# Patient Record
Sex: Female | Born: 1955 | Race: White | Hispanic: No | Marital: Married | Smoking: Former smoker
Health system: Southern US, Community
[De-identification: ages and names within clinical notes are randomized; demographics above are authoritative.]

## PROBLEM LIST (undated history)

## (undated) DIAGNOSIS — I2699 Other pulmonary embolism without acute cor pulmonale: Secondary | ICD-10-CM

## (undated) DIAGNOSIS — M5410 Radiculopathy, site unspecified: Principal | ICD-10-CM

## (undated) DIAGNOSIS — I10 Essential (primary) hypertension: Secondary | ICD-10-CM

## (undated) DIAGNOSIS — E7800 Pure hypercholesterolemia, unspecified: Secondary | ICD-10-CM

## (undated) DIAGNOSIS — D649 Anemia, unspecified: Secondary | ICD-10-CM

## (undated) DIAGNOSIS — R001 Bradycardia, unspecified: Secondary | ICD-10-CM

## (undated) DIAGNOSIS — E1149 Type 2 diabetes mellitus with other diabetic neurological complication: Principal | ICD-10-CM

## (undated) DIAGNOSIS — E119 Type 2 diabetes mellitus without complications: Secondary | ICD-10-CM

## (undated) DIAGNOSIS — E039 Hypothyroidism, unspecified: Secondary | ICD-10-CM

## (undated) DIAGNOSIS — J189 Pneumonia, unspecified organism: Secondary | ICD-10-CM

## (undated) HISTORY — DX: Type 2 diabetes mellitus without complications: E11.9

## (undated) HISTORY — DX: Type 2 diabetes mellitus with other diabetic neurological complication: E11.49

## (undated) HISTORY — PX: APPENDECTOMY: SHX54

## (undated) HISTORY — PX: REFRACTIVE SURGERY: SHX103

## (undated) HISTORY — DX: Radiculopathy, site unspecified: M54.10

## (undated) HISTORY — PX: NECK SURGERY: SHX720

## (undated) HISTORY — DX: Other pulmonary embolism without acute cor pulmonale: I26.99

## (undated) HISTORY — DX: Bradycardia, unspecified: R00.1

## (undated) HISTORY — PX: TONSILLECTOMY: SUR1361

## (undated) HISTORY — DX: Pure hypercholesterolemia, unspecified: E78.00

## (undated) HISTORY — PX: OTHER SURGICAL HISTORY: SHX169

## (undated) HISTORY — DX: Essential (primary) hypertension: I10

## (undated) HISTORY — PX: ABDOMINAL HYSTERECTOMY: SHX81

---

## 1988-02-26 HISTORY — PX: BREAST SURGERY: SHX581

## 2008-01-20 ENCOUNTER — Inpatient Hospital Stay (HOSPITAL_COMMUNITY): Admission: AD | Admit: 2008-01-20 | Discharge: 2008-01-27 | Payer: Self-pay | Admitting: Internal Medicine

## 2008-01-20 ENCOUNTER — Ambulatory Visit: Payer: Self-pay | Admitting: Cardiology

## 2008-01-20 ENCOUNTER — Ambulatory Visit: Payer: Self-pay | Admitting: Internal Medicine

## 2008-01-22 ENCOUNTER — Encounter (INDEPENDENT_AMBULATORY_CARE_PROVIDER_SITE_OTHER): Payer: Self-pay | Admitting: *Deleted

## 2008-01-22 ENCOUNTER — Ambulatory Visit: Payer: Self-pay | Admitting: *Deleted

## 2008-01-29 ENCOUNTER — Ambulatory Visit: Payer: Self-pay | Admitting: Cardiology

## 2008-02-03 ENCOUNTER — Ambulatory Visit: Payer: Self-pay | Admitting: Cardiology

## 2008-06-08 ENCOUNTER — Encounter (HOSPITAL_COMMUNITY): Admission: RE | Admit: 2008-06-08 | Discharge: 2008-08-22 | Payer: Self-pay | Admitting: Unknown Physician Specialty

## 2008-07-11 ENCOUNTER — Ambulatory Visit (HOSPITAL_COMMUNITY): Admission: RE | Admit: 2008-07-11 | Discharge: 2008-07-11 | Payer: Self-pay | Admitting: Ophthalmology

## 2008-11-14 DIAGNOSIS — I498 Other specified cardiac arrhythmias: Secondary | ICD-10-CM

## 2008-12-08 ENCOUNTER — Encounter (HOSPITAL_COMMUNITY): Admission: RE | Admit: 2008-12-08 | Discharge: 2009-03-08 | Payer: Self-pay | Admitting: Unknown Physician Specialty

## 2009-02-25 HISTORY — PX: EXCISIONAL HEMORRHOIDECTOMY: SHX1541

## 2009-06-14 ENCOUNTER — Ambulatory Visit (HOSPITAL_COMMUNITY): Admission: RE | Admit: 2009-06-14 | Discharge: 2009-06-14 | Payer: Self-pay | Admitting: General Surgery

## 2009-07-20 ENCOUNTER — Encounter (HOSPITAL_COMMUNITY): Admission: RE | Admit: 2009-07-20 | Discharge: 2009-10-04 | Payer: Self-pay | Admitting: Unknown Physician Specialty

## 2010-03-01 ENCOUNTER — Encounter (HOSPITAL_COMMUNITY)
Admission: RE | Admit: 2010-03-01 | Discharge: 2010-03-27 | Payer: Self-pay | Source: Home / Self Care | Attending: Unknown Physician Specialty | Admitting: Unknown Physician Specialty

## 2010-05-15 LAB — BASIC METABOLIC PANEL
CO2: 23 mEq/L (ref 19–32)
Chloride: 94 mEq/L — ABNORMAL LOW (ref 96–112)
Creatinine, Ser: 0.98 mg/dL (ref 0.4–1.2)
Potassium: 4 mEq/L (ref 3.5–5.1)
Sodium: 130 mEq/L — ABNORMAL LOW (ref 135–145)

## 2010-05-15 LAB — CBC
Hemoglobin: 12.6 g/dL (ref 12.0–15.0)
MCV: 100.6 fL — ABNORMAL HIGH (ref 78.0–100.0)
Platelets: 286 10*3/uL (ref 150–400)
RDW: 17.7 % — ABNORMAL HIGH (ref 11.5–15.5)
WBC: 13.7 10*3/uL — ABNORMAL HIGH (ref 4.0–10.5)

## 2010-05-15 LAB — GLUCOSE, CAPILLARY

## 2010-07-10 NOTE — H&P (Signed)
[name], [name] NO.:  [id number]   MEDICAL RECORD NO.:  [id number]          PATIENT TYPE:  INP   LOCATION:  2031                         FACILITY:  [facility]   PHYSICIAN:  Rollene Rotunda, MD, FACCDATE OF BIRTH:  12-Jun-1955   DATE OF ADMISSION:  01/20/2008  DATE OF DISCHARGE:                              HISTORY & PHYSICAL   PRIMARY CARDIOLOGIST:  The patient is here at our group.  She is being  seen by Dr. Antoine Poche here at [facility] today.  She will be followed up  with the team at [facility] either Dr. Myrtis Ser or Dr. Andee Lineman post  hospitalization.   PRIMARY CARE:  Selinda Flavin.   RHEUMATOLOGIST:  Dr. Jennette Bill (Dr. Phylliss Bob).   We were asked to consult on this 55 year old Caucasian female for sinus  bradycardia, which is asymptomatic.  [name] has had multiple problems  recently with the diagnosis of left lower extremity DVT with Coumadin  initiation and Lovenox bridging, subsequently developed bilateral  pulmonary emboli and then also suffered a rheumatoid arthritis flare up  that has kept her here in the hospital for several days.  The patient  was noted to have a bradycardic rhythm yesterday.  She is dipped in the  high 30s to low 40s.  As stated, she has been asymptomatic.  Denies any  lightheadedness, dizziness, presyncopal episodes, or weakness.  Main  complaint has been joint pain associated with her rheumatoid arthritis.  [name] also states she was told 3 weeks ago that she had the swine  flu and was treated with Tamiflu at [facility].   PAST MEDICAL HISTORY:  1. Rheumatoid arthritis x2 years.  2. Fibromyalgia.  3. Recent diagnosis of DVT, left lower extremities, Coumadin and      Lovenox bridging.  4. Subsequently developed bilateral pulmonary emboli while therapeutic      on her Coumadin.  5. Also has a remote history of a DVT approximately 10 years ago in      her right lower extremity.  6. Diabetes.  7. Hypertension.  8.  Dyslipidemia.  9. Steroid dependency for RA.  10.Status post appendectomy.  11.Status post hysterectomy.  12.A 2-D echocardiogram obtained this admission showed mild diastolic      dysfunction with an EF of 60-65%.   SOCIAL HISTORY:  The patient lives in [place] with her husband.  She is a  Nutritional therapist at [employer].  Previous tobacco use, quit  greater than 10 years ago.  Denies any EtOH, drug, or herbal medication  use.  She does enjoy riding her stationary bike when she is able to.  Tries to follow ADA diet.   FAMILY HISTORY:  Mother deceased at age 43, suffered MI.  Father  deceased at age 49 secondary to MI.  He had his first MI at age 29.  Sister deceased secondary to liver disease.   REVIEW OF SYSTEMS:  Positive for fever, chills, hot flashes, chest pain,  shortness of breath, dyspnea on exertion, lower extremity edema,  myalgia, arthralgia, joint swelling, pain in joints, and occasional  polyuria.  ALLERGIES:  No known drug allergies.   MEDICATIONS:  1. Plaquenil 200 mg b.i.d.  2. Coumadin.  3. Glipizide 10.  4. Folic acid.  5. Lyrica.  6. Protonix.  7. Zocor 20.  8. Cimzia every 28-day injection.  9. Insulin sliding scale.  10.Prednisone 60 mg p.o. b.i.d.  11.Percocet.  12.Lantus insulin.   PHYSICAL EXAMINATION:  VITAL SIGNS:  Temperature 97.6, heart rate 46,  respirations 22, and blood pressure 150/77.  GENERAL:  [name] is in no acute distress.  HEENT:  Normocephalic and atraumatic.  Pupils equal, round, and reactive  to light.  She has a cushingoid appearance.  Cheeks are red/flushed.  NECK:  Supple without bruits, JVD.  CARDIOVASCULAR:  S1 and S2.  Brady rhythm.  Do not hear any murmurs,  rubs, or gallops.  LUNGS:  Clear to auscultation.  SKIN:  Warm and dry.  ABDOMEN:  Obese, soft, and nontender with positive bowel sounds.  LOWER EXTREMITIES:  Without clubbing or cyanosis.  She does have  nonpitting edema bilaterally, which she states is  chronic for her.  NEUROLOGIC:  Alert and oriented x3.   Chest x-ray shows enlargement of cardiopericardial silhouette, otherwise  no acute findings.  EKG sinus brady at a rate of 37.  No acute ST or T-  wave changes.   LABORATORY DATA:  H&H 9.9 and 29.8, WBC 7.2, and platelets 503,000.  Sodium 137, potassium 5.3, BUN 34, creatinine 1.05, and glucose 418.  TSH 1.632 and INR 3.3.  ANA negative.  Anti-DNA less than 1.  Folic RBC  1369.  Positive SLE antibody.   IMPRESSION:  1. Sinus brady, asymptomatic, unclear etiology at this time.  We will      plan on outpatient Holter monitor, check for chronotropic      components, have the patient followup in the [facility] office.  2. Positive systemic lupus erythematosus antibody.  3. Rheumatoid arthritis flare-up.  4. Chronic anticoagulation therapy.  5. Deep venous thrombosis/bilateral pulmonary emboli per attending      physician.  Dr. Rollene Rotunda has been in to examine and assess.      The patient is a 55 year old Caucasian female with no cardiac      history, now      bradycardic in the setting of RA exacerbation, DVT/pulmonary      emboli.  The patient will need outpatient 48 Holter monitor to      check chronotropic components then followup in the [facility] office.      Dr. Rollene Rotunda has been in to examine and assess.  The patient      agrees with plan of care.      Dorian Pod, ACNP      Rollene Rotunda, MD, [facility]  Electronically Signed    MB/MEDQ  D:  01/26/2008  T:  01/27/2008  Job:  [id number]

## 2010-07-10 NOTE — Assessment & Plan Note (Signed)
[facility]                          [facility] NOTE   [name], [name]                         MRN:          [id number]  DATE:02/03/2008                            DOB:          [birth year]    PRIMARY CARE PHYSICIAN:  Dr. Selinda Flavin   REASON FOR VISIT:  Followup bradycardia and cardiac monitor.   HISTORY OF PRESENT ILLNESS:  [name] is a pleasant 55 year old woman  with a history of rheumatoid arthritis, fibromyalgia, and recently  diagnosed deep venous thrombosis involving the left lower extremity with  subsequent development of bilateral pulmonary emboli.  She is on  Coumadin therapy followed by Dr. Dimas Aguas.  She was seen in consultation  by Dr. Antoine Poche at [facility] back in late November due to  sinus bradycardia.  She was noted to have heart rates dipping into the  30s and 40s, although was relatively asymptomatic at that time without  any frank dizziness, sense of palpitations, or syncope.  She underwent  an echocardiogram, which demonstrated normal left ventricular systolic  function with an ejection fraction of 60-65%, no regional wall motion  abnormalities, mild left ventricular hypertrophy, mildly increased  pulmonary artery pressures, and a minimal pericardial effusion.  She was  ultimately discharged home on no specific cardiac medications and was  arranged to wear a 48-hour cardiac monitor.  I reviewed this study  today.  Predominant rhythm is sinus ranging from bradycardia as low as  40 in the early morning hours while the patient was sleeping up to sinus  tachycardia of 118 beats per minute during waking hours.  Her mean heart  rate was 59 and she does not report any specific symptoms while wearing  her monitor.  She had no significant pauses and no ventricular ectopy.  Occasional premature atrial complexes were noted, mostly as single  beats, but with two atrial pairs and one 8-beat run of atrial  tachycardia.  I asked her about palpitations, dizziness, or syncope  today, and she denies having any problems with these symptoms.  She is  generally recuperating with her recent comorbid illnesses and has been  followed closely by Dr. Dimas Aguas.  She states that her energy is improving  daily.   ALLERGIES:  No known drug allergies.   MEDICATIONS:  1. Folic acid 1 mg p.o. daily.  2. Glipizide 10 mg p.o. daily.  3. Plaquenil 200 mg p.o. daily.  4. Lantus 20-30 units subcu nightly.  5. Protonix 40 mg p.o. b.i.d.  6. Prednisone 50 mg p.o. daily with taper underway.  7. Lyrica 75 mg 2 tablets p.o. b.i.d.  8. Coumadin as directed by Dr. Dimas Aguas.  9. Methotrexate 25 mg weekly.   REVIEW OF SYSTEMS:  As outlined above.  No hemoptysis.  Lower extremity  edema has improved significantly by her report.  No exertional chest  pain.  Otherwise negative.   PHYSICAL EXAMINATION:  VITAL SIGNS:  Blood pressure is 151/88, heart  rate is 67 and regular, weight is 207 pounds.  GENERAL:  The patient is comfortable, overweight, in no acute distress.  NECK:  No elevated jugular venous pressure or loud carotid bruits.  No  thyromegaly.  LUNGS:  Clear with diminished breath sounds.  CARDIAC:  Regular rate and rhythm.  No pathologic systolic murmur or  obvious pericardial rub.  No S3 gallop.  ABDOMEN:  Obese, nontender.  EXTREMITIES:  Exhibit resolving 1+ edema left greater than right.  Distal pulses are 1+.  SKIN:  Warm and dry.  MUSCULOSKELETAL:  No kyphosis noted.   IMPRESSION AND RECOMMENDATIONS:  Previous documentation of asymptomatic  sinus bradycardia in the setting of other active comorbid illnesses  including a flare of rheumatoid arthritis and also thromboembolic  disease with deep venous thrombosis and pulmonary emboli, requiring  Coumadin.  Ms. Butsch reports continued improvement as she recuperates  under the direction of Dr. Dimas Aguas.  She did wear a 48-hour monitor,  which does not indicate  any clear evidence of major sinus node  dysfunction, either chronotropic incompetence, or significant pauses.  Her most significant bradycardic events were at nighttime while she was  asleep.  She has otherwise been asymptomatic in terms of palpitations  despite findings of premature atrial complexes.  She had documentation  of normal left ventricular systolic function by recent echocardiography.  At this point, I would recommend observation and no other specific  cardiac evaluation unless she manifests any progressive symptomatology.  Evaluation for OSA could be considered at some point.  She is on no  medications which should be adversely affecting her heart rate.  She  will continue to follow up with Dr. Dimas Aguas and we can see her back as  needed.     Jonelle Sidle, MD  Electronically Signed    SGM/MedQ  DD: 02/03/2008  DT: 02/04/2008  Job #: [id number]   cc:   Selinda Flavin

## 2010-07-13 NOTE — Discharge Summary (Signed)
NAMEJACOLE, [name] NO.:  [id number]   MEDICAL RECORD NO.:  [id number]          PATIENT TYPE:  INP   LOCATION:  2031                         FACILITY:  [facility]   PHYSICIAN:  [name], M.D.    DATE OF BIRTH:  06/28/1955   DATE OF ADMISSION:  01/20/2008  DATE OF DISCHARGE:  01/27/2008                               DISCHARGE SUMMARY   PRIMARY CARE [name]:  [name] in [place].   DISCHARGE DIAGNOSES:  1. Pulmonary embolism. Anti-phospholipid antibody positive.  2. Deep vein thrombosis, left extremity.  3. Rheumatoid arthritis.  4. Type 2 diabetes.  5. Acute renal insufficiency.  6. Hypertension.  7. Hyperlipidemia.   DISCHARGE MEDICATIONS WITH ACCURATE DOSES:  1. Folic acid 1 mg daily.  2. Glipizide 10 mg daily.  3. Plaquenil 200 mg once daily.  4. Lantus insulin 20 units in evening only.  5. Protonix 40 mg 1 tablet twice daily.  6. Prednisone 60 mg tablet once daily for 3 days after the discharge,      50 mg tablet once daily for 3 days subsequently, to dose prednisone      as per rheumatologist after the visit.  7. Lyrica 150 mg 1 tablet twice daily.  8. Simvastatin 20 mg 1 tablet daily.  9. Coumadin 7.5 mg 1 tablet daily. GOAL INR 3.0 TO 4.0  10.Methotrexate 25 mg once weekly.  11.Cimzia per rheumatologist.  12.Blood pressure medications to be held until seen by primary care      [name].   DISPOSITION AND FOLLOWUP:  1. Follow up to see [name] on January 29, 2008, to call the      office for appointment time.  2. Follow up at [facility] in [facility] to get Holter      monitor and to see the physician on February 03, 2008, at 1 p.m.      NOTE HIGH THERAPEUTIC INR is needed (3.0 to 4.0)  3. Follow up with [name], primary care [name], on January 29, 2008, at 10:45 a.m.   PROCEDURE PERFORMED:  Chest x-ray shows enlargement of pericardial  silhouette without edema or peripheral aspirate consolidation.   CONSULTATIONS:  None.   ADMITTING HISTORY AND PHYSICAL:  The patient is a 55 year old woman with  past medical history significant for left extremity DVT 2 weeks ago,  history of right lower extremity DVT 10 years ago, hyperlipidemia, type  2 diabetes, rheumatoid arthritis, hypertension, and obesity. She  presented to the [facility] with left lower extremity pain two  weeks ago and was diagnosed with DVT. Subseuqntly, She was treated  Lovenox and Coumadin until January 15, 2008, at which time, her Lovenox  was discontinued and her Coumadin dose was increased from 5 mg to 7.5  mg.  She then went back to ED with complain of shortness of breath, and chest  pain.  The patient reported that around 6:30 p.m., the night of  presentation, she had left lower extremity pain with shortness of breath  and chest pain associated with breathing.  CT angio  revealed bilateral  pulmonary embolism while she was on coumidin with INR of 1.9.  The  patient was transfered for further management to the [facility].   PHYSICAL EXAMINATION:  VITALS ON PRESENTATION:  Temperature of 101.9,  pulse of 103, blood pressure of 142/66, respiratory rate of 21, and O2  saturation 97% on 4L on oxygen.  GENERAL:  No acute distress. The patient was comfortable in the bed.  HEENT:  Atraumatic.  The patient had a moon face without erythema,  positive for telangiectasias.  CARDIOVASCULAR:  S1 and S2 normal.  The patient was tachycardic, rhythm  was regular without murmur or regurgitation.  LUNGS:  Clear to auscultation bilaterally.  No wheezing, no rhonchi, and  no rales.  ABDOMEN:  Soft and obese without tenderness, without guarding. with  positive bowel sounds.  NEUROLOGIC:  Alert and oriented x3.  No motor or sensory deficits.  Reflexes are normal bilaterally.  Negative Romberg sign.  No focal  deficits.  EXTREMITIES:  Mild bilateral lower leg edema, nontender, erythematous  wrist, ankles, foot, and  fingers bilaterally.  No cord could be palpated  in the calf muscles.   LABORATORY DATA:  Sodium 135, potassium 4.2, chloride of 96, bicarb of  26, BUN of 9, creatinine 1.1, and glucose of 266.  Hemoglobin of 11,  white cell count of 14.4, platelet of 447, ANC of 17.6, MCV of 106.9,  RDW of 20.7, calcium of 9.9, CPK 25.3, INR 2.2.  CT angio showing  bilateral PE.   HOSPITAL COURSE:  Problem #1.  Pulmonary embolism.  The patient was  being dosed, therapeutic at the time of pulmonary embolism diagnosis.  The patient has experienced left pleuritic chest pain and shortness of  breath for which she needs to be admitted to hospital.  The patient was  previously diagnosed with DVT, treated  with Lovenox and Coumadin, the  biggest was of Coumadin alone with therapeutic INR of 1.9.  With that  diagnosis, the patient was admitted, considered for IV fluid  replacement, for Coumadin, fluids as needed.  The patient was discharged  with an INR of 3.1 and PTT of 34.5.  The patient was investigated for  lupus anticoagulant which was found as the assay revealed findings c/w  an anti=phopholipid antibody.  ANA was negative.  The patient needs  lifelong anticoagulation.  as she failed an INR of 2.0, [name] was  given a choice of lifelong therapy of lovenox or a trial on Coumadin  with a hiogh INR. She chose the latter but was willing to use Lovenox if  the high INR failed to prevent further thrombo-embolic events. She will  follow up with her primary care [name] with discharge for INR  monitoring.  Problem #2.  DVT.  The patient has symptoms of left lower extremity DVT.  The patient was anticoagulated with Lovenox and Coumadin.  The patient's  management will continue to be discussed.  Problem #3.  Rheumatoid arthritis.  The patient was continued Plaquenil  and prednisone dose was increased to 60 mg p.o. daily, however, the  patient continued to show signs and symptoms of rheumatoid arthritis   flare, multiple joints including wrist, ankles, and fingers were  swollen.  The steroid dose was increased to stress dose for intravenous  methylprednisolone.  The patient responded to the therapy, the joint  swelling decreased, the range of motion improved.  The patient had  lowering of white blood cell count to 17.5.  The patient  does not have  any fever during the hospitalization.  The patient was discharged with a  globe prednisone taper 60 mg of once daily dose of oral prednisone.  The  patient will follow up with [name] and he would determine the  duration and the dose of prednisone taper.  Problem #3.  Type 2 diabetes.  The patient had a high blood sugar likely  secondary to her type 2 diabetes as well as high-dose prednisone  therapy.  The patient was managed with Lantus insulin as well as sliding  scale insulin coverage.  The patient will be discharged with a dose of  Lantus insulin 20 units in a.m. only to prevent hypoglycemia as it might  occur with lower dose of prednisone.The patient will follow up with  primary care [name], [name] for continued diabetic management.  Problem #4.  Hypertension.  The patient at home was on antihypertensive  medicines including Benicar and hydrochlorothiazide.  These medicines  were not continued inpatient as the patient continued to show some poor  pressures and due to fear of developing hypertension, the patient was  discharged without continued antihypertensive medicines.  The patient  will follow up with primary care [name] for continued healthcare  management of hypertension.  Problem #5.  Hyperlipidemia.  The patient's lipid profile was tagged.  The patient had an LDL of 112 with HDL of 28.  The patient was treated  with simvastatin 20 mg tablet once daily.  The patient will follow up  with a primary care [name] for encouraging dose of statin if required.   VITALS AT THE TIME OF DISCHARGE:  Temperature 97.9, pulse of 64,   respiration of 18, blood pressure of 156/70, and O2 saturation of 97% on  room air.   LABORATORY DATA:  White blood cell count of 17.5, hemoglobin of 9.7,  hematocrit of 29.5, MCV of 105.2, and platelet of 471.  Sodium of 134,  potassium of 4.6, chloride of 104, bicarbonate 23, glucose of 445, BUN  of 20, creatinine of 0.83, and calcium of 5.0.      [name], [name]  Electronically Signed      [name], M.D.  Electronically Signed    RS/MEDQ  D:  02/08/2008  T:  02/09/2008  Job:  [id number]   cc:   Dr. Louann Sjogren

## 2010-09-18 ENCOUNTER — Encounter (HOSPITAL_COMMUNITY): Payer: Self-pay

## 2010-10-01 ENCOUNTER — Encounter (HOSPITAL_COMMUNITY): Payer: Self-pay

## 2010-10-30 ENCOUNTER — Encounter (HOSPITAL_COMMUNITY)
Admission: RE | Admit: 2010-10-30 | Discharge: 2010-10-30 | Disposition: A | Discharge status: Home / Self Care | Payer: 59 | Source: Ambulatory Visit | Attending: Rheumatology | Admitting: Rheumatology

## 2010-10-30 DIAGNOSIS — M069 Rheumatoid arthritis, unspecified: Secondary | ICD-10-CM | POA: Insufficient documentation

## 2010-11-13 ENCOUNTER — Encounter (HOSPITAL_COMMUNITY): Payer: 59

## 2010-11-27 LAB — PHOSPHORUS: Phosphorus: 4.7 mg/dL — ABNORMAL HIGH (ref 2.3–4.6)

## 2010-11-27 LAB — HEMOGLOBIN A1C: Mean Plasma Glucose: 171 mg/dL

## 2010-11-27 LAB — CBC
HCT: 27.7 % — ABNORMAL LOW (ref 36.0–46.0)
HCT: 28.9 % — ABNORMAL LOW (ref 36.0–46.0)
HCT: 29.5 % — ABNORMAL LOW (ref 36.0–46.0)
HCT: 30.1 % — ABNORMAL LOW (ref 36.0–46.0)
HCT: 30.6 % — ABNORMAL LOW (ref 36.0–46.0)
Hemoglobin: 9 g/dL — ABNORMAL LOW (ref 12.0–15.0)
Hemoglobin: 9.8 g/dL — ABNORMAL LOW (ref 12.0–15.0)
Hemoglobin: 9.9 g/dL — ABNORMAL LOW (ref 12.0–15.0)
MCHC: 32.4 g/dL (ref 30.0–36.0)
MCHC: 32.6 g/dL (ref 30.0–36.0)
MCHC: 32.8 g/dL (ref 30.0–36.0)
MCHC: 33.7 g/dL (ref 30.0–36.0)
MCV: 105.8 fL — ABNORMAL HIGH (ref 78.0–100.0)
MCV: 106 fL — ABNORMAL HIGH (ref 78.0–100.0)
MCV: 106 fL — ABNORMAL HIGH (ref 78.0–100.0)
Platelets: 413 10*3/uL — ABNORMAL HIGH (ref 150–400)
Platelets: 446 10*3/uL — ABNORMAL HIGH (ref 150–400)
Platelets: 450 10*3/uL — ABNORMAL HIGH (ref 150–400)
Platelets: 484 10*3/uL — ABNORMAL HIGH (ref 150–400)
Platelets: 547 10*3/uL — ABNORMAL HIGH (ref 150–400)
RBC: 2.79 MIL/uL — ABNORMAL LOW (ref 3.87–5.11)
RDW: 20.5 % — ABNORMAL HIGH (ref 11.5–15.5)
RDW: 20.9 % — ABNORMAL HIGH (ref 11.5–15.5)
RDW: 21 % — ABNORMAL HIGH (ref 11.5–15.5)
RDW: 21.1 % — ABNORMAL HIGH (ref 11.5–15.5)
WBC: 18.1 10*3/uL — ABNORMAL HIGH (ref 4.0–10.5)

## 2010-11-27 LAB — DIFFERENTIAL
Basophils Absolute: 0 10*3/uL (ref 0.0–0.1)
Basophils Absolute: 0.1 10*3/uL (ref 0.0–0.1)
Basophils Relative: 0 % (ref 0–1)
Basophils Relative: 0 % (ref 0–1)
Eosinophils Absolute: 0 10*3/uL (ref 0.0–0.7)
Eosinophils Absolute: 0 10*3/uL (ref 0.0–0.7)
Eosinophils Relative: 0 % (ref 0–5)
Eosinophils Relative: 0 % (ref 0–5)
Monocytes Absolute: 0.4 10*3/uL (ref 0.1–1.0)

## 2010-11-27 LAB — GLUCOSE, CAPILLARY
Glucose-Capillary: 131 mg/dL — ABNORMAL HIGH (ref 70–99)
Glucose-Capillary: 297 mg/dL — ABNORMAL HIGH (ref 70–99)
Glucose-Capillary: 306 mg/dL — ABNORMAL HIGH (ref 70–99)
Glucose-Capillary: 336 mg/dL — ABNORMAL HIGH (ref 70–99)
Glucose-Capillary: 344 mg/dL — ABNORMAL HIGH (ref 70–99)
Glucose-Capillary: 360 mg/dL — ABNORMAL HIGH (ref 70–99)
Glucose-Capillary: 411 mg/dL — ABNORMAL HIGH (ref 70–99)
Glucose-Capillary: 426 mg/dL — ABNORMAL HIGH (ref 70–99)
Glucose-Capillary: 468 mg/dL — ABNORMAL HIGH (ref 70–99)

## 2010-11-27 LAB — BASIC METABOLIC PANEL
BUN: 13 mg/dL (ref 6–23)
BUN: 13 mg/dL (ref 6–23)
BUN: 28 mg/dL — ABNORMAL HIGH (ref 6–23)
BUN: 35 mg/dL — ABNORMAL HIGH (ref 6–23)
CO2: 21 mEq/L (ref 19–32)
CO2: 21 mEq/L (ref 19–32)
CO2: 23 mEq/L (ref 19–32)
CO2: 25 mEq/L (ref 19–32)
Calcium: 9.4 mg/dL (ref 8.4–10.5)
Chloride: 104 mEq/L (ref 96–112)
Chloride: 106 mEq/L (ref 96–112)
Chloride: 99 mEq/L (ref 96–112)
Creatinine, Ser: 1.06 mg/dL (ref 0.4–1.2)
GFR calc Af Amer: 44 mL/min — ABNORMAL LOW (ref >60)
GFR calc Af Amer: >60 mL/min (ref >60)
GFR calc non Af Amer: 36 mL/min — ABNORMAL LOW (ref >60)
GFR calc non Af Amer: 53 mL/min — ABNORMAL LOW (ref >60)
GFR calc non Af Amer: 55 mL/min — ABNORMAL LOW (ref >60)
GFR calc non Af Amer: 59 mL/min — ABNORMAL LOW (ref >60)
GFR calc non Af Amer: >60 mL/min (ref >60)
Glucose, Bld: 226 mg/dL — ABNORMAL HIGH (ref 70–99)
Glucose, Bld: 332 mg/dL — ABNORMAL HIGH (ref 70–99)
Glucose, Bld: 381 mg/dL — ABNORMAL HIGH (ref 70–99)
Glucose, Bld: 480 mg/dL — ABNORMAL HIGH (ref 70–99)
Potassium: 4 mEq/L (ref 3.5–5.1)
Potassium: 4.4 mEq/L (ref 3.5–5.1)
Potassium: 4.9 mEq/L (ref 3.5–5.1)
Sodium: 134 mEq/L — ABNORMAL LOW (ref 135–145)
Sodium: 137 mEq/L (ref 135–145)
Sodium: 137 mEq/L (ref 135–145)

## 2010-11-27 LAB — ANTIPHOSPHOLIPID SYNDROME EVAL, BLD
DRVVT: 98 secs — ABNORMAL HIGH (ref 36.1–47.0)
PTT Lupus Anticoagulant: 94.8 secs — ABNORMAL HIGH (ref 36.3–48.8)
PTTLA Confirmation: 14.4 secs — ABNORMAL HIGH (ref <8.0)
dRVVT Incubated 1:1 Mix: 43.3 secs (ref 36.1–47.0)

## 2010-11-27 LAB — IRON AND TIBC: Saturation Ratios: 4 % — ABNORMAL LOW (ref 20–55)

## 2010-11-27 LAB — CARDIAC PANEL(CRET KIN+CKTOT+MB+TROPI)
Relative Index: INVALID (ref 0.0–2.5)
Relative Index: INVALID (ref 0.0–2.5)
Total CK: 13 U/L (ref 7–177)
Total CK: 16 U/L (ref 7–177)
Troponin I: 0.01 ng/mL (ref 0.00–0.06)
Troponin I: 0.04 ng/mL (ref 0.00–0.06)

## 2010-11-27 LAB — CULTURE, BLOOD (ROUTINE X 2)

## 2010-11-27 LAB — URINALYSIS, ROUTINE W REFLEX MICROSCOPIC
Glucose, UA: NEGATIVE mg/dL
Hgb urine dipstick: NEGATIVE
Ketones, ur: NEGATIVE mg/dL
Leukocytes, UA: NEGATIVE
pH: 5.5 (ref 5.0–8.0)

## 2010-11-27 LAB — COMPREHENSIVE METABOLIC PANEL
ALT: 22 U/L (ref 0–35)
AST: 15 U/L (ref 0–37)
Albumin: 2.9 g/dL — ABNORMAL LOW (ref 3.5–5.2)
Alkaline Phosphatase: 66 U/L (ref 39–117)
BUN: 11 mg/dL (ref 6–23)
Creatinine, Ser: 0.96 mg/dL (ref 0.4–1.2)
GFR calc Af Amer: >60 mL/min (ref >60)
Glucose, Bld: 94 mg/dL (ref 70–99)
Potassium: 3.8 mEq/L (ref 3.5–5.1)
Sodium: 137 mEq/L (ref 135–145)
Total Bilirubin: 0.7 mg/dL (ref 0.3–1.2)

## 2010-11-27 LAB — URINE MICROSCOPIC-ADD ON

## 2010-11-27 LAB — LIPID PANEL
Cholesterol: 174 mg/dL (ref 0–200)
HDL: 28 mg/dL — ABNORMAL LOW (ref >39)
Total CHOL/HDL Ratio: 6.2 RATIO
VLDL: 34 mg/dL (ref 0–40)

## 2010-11-27 LAB — PROTIME-INR
INR: 2.1 — ABNORMAL HIGH (ref 0.00–1.49)
INR: 3.8 — ABNORMAL HIGH (ref 0.00–1.49)
Prothrombin Time: 31.7 seconds — ABNORMAL HIGH (ref 11.6–15.2)
Prothrombin Time: 40.7 seconds — ABNORMAL HIGH (ref 11.6–15.2)

## 2010-11-27 LAB — HEPARIN LEVEL (UNFRACTIONATED)
Heparin Unfractionated: 0.12 IU/mL — ABNORMAL LOW (ref 0.30–0.70)
Heparin Unfractionated: 0.6 IU/mL (ref 0.30–0.70)
Heparin Unfractionated: 0.7 IU/mL (ref 0.30–0.70)

## 2010-11-27 LAB — SODIUM, URINE, RANDOM: Sodium, Ur: 86 mEq/L

## 2010-11-27 LAB — OCCULT BLOOD X 1 CARD TO LAB, STOOL: Fecal Occult Bld: NEGATIVE

## 2010-11-30 LAB — CBC
HCT: 29.8 % — ABNORMAL LOW (ref 36.0–46.0)
Hemoglobin: 9.7 g/dL — ABNORMAL LOW (ref 12.0–15.0)
Hemoglobin: 9.9 g/dL — ABNORMAL LOW (ref 12.0–15.0)
Platelets: 503 10*3/uL — ABNORMAL HIGH (ref 150–400)
RBC: 2.8 MIL/uL — ABNORMAL LOW (ref 3.87–5.11)
RDW: 20.8 % — ABNORMAL HIGH (ref 11.5–15.5)
WBC: 17.2 10*3/uL — ABNORMAL HIGH (ref 4.0–10.5)
WBC: 17.5 10*3/uL — ABNORMAL HIGH (ref 4.0–10.5)

## 2010-11-30 LAB — GLUCOSE, CAPILLARY
Glucose-Capillary: 382 mg/dL — ABNORMAL HIGH (ref 70–99)
Glucose-Capillary: 387 mg/dL — ABNORMAL HIGH (ref 70–99)
Glucose-Capillary: 393 mg/dL — ABNORMAL HIGH (ref 70–99)
Glucose-Capillary: 418 mg/dL — ABNORMAL HIGH (ref 70–99)
Glucose-Capillary: 427 mg/dL — ABNORMAL HIGH (ref 70–99)
Glucose-Capillary: 495 mg/dL — ABNORMAL HIGH (ref 70–99)

## 2010-11-30 LAB — BASIC METABOLIC PANEL
Calcium: 9 mg/dL (ref 8.4–10.5)
Calcium: 9.7 mg/dL (ref 8.4–10.5)
Creatinine, Ser: 0.83 mg/dL (ref 0.4–1.2)
GFR calc Af Amer: >60 mL/min (ref >60)
GFR calc non Af Amer: 55 mL/min — ABNORMAL LOW (ref >60)
GFR calc non Af Amer: >60 mL/min (ref >60)
Glucose, Bld: 418 mg/dL — ABNORMAL HIGH (ref 70–99)
Potassium: 5.3 mEq/L — ABNORMAL HIGH (ref 3.5–5.1)
Sodium: 134 mEq/L — ABNORMAL LOW (ref 135–145)
Sodium: 137 mEq/L (ref 135–145)

## 2010-11-30 LAB — PROTIME-INR
INR: 3.1 — ABNORMAL HIGH (ref 0.00–1.49)
INR: 3.3 — ABNORMAL HIGH (ref 0.00–1.49)
Prothrombin Time: 34.5 seconds — ABNORMAL HIGH (ref 11.6–15.2)

## 2011-05-08 ENCOUNTER — Other Ambulatory Visit (HOSPITAL_COMMUNITY): Payer: Self-pay | Admitting: *Deleted

## 2011-05-09 ENCOUNTER — Encounter (HOSPITAL_COMMUNITY)
Admission: RE | Admit: 2011-05-09 | Discharge: 2011-05-09 | Disposition: A | Discharge status: Home / Self Care | Payer: 59 | Source: Ambulatory Visit | Attending: Rheumatology | Admitting: Rheumatology

## 2011-05-09 DIAGNOSIS — M069 Rheumatoid arthritis, unspecified: Secondary | ICD-10-CM | POA: Insufficient documentation

## 2011-05-09 MED ORDER — SODIUM CHLORIDE 0.9 % IV SOLN
1000.0000 mg | INTRAVENOUS | Status: DC
  Administered 2011-05-09: 1000 mg via INTRAVENOUS
  Filled 2011-05-09: qty 100

## 2011-05-09 MED ORDER — SODIUM CHLORIDE 0.9 % IV SOLN
INTRAVENOUS | Status: DC
  Administered 2011-05-09: 250 mL via INTRAVENOUS

## 2011-05-09 MED ORDER — METHYLPREDNISOLONE SODIUM SUCC 125 MG IJ SOLR
100.0000 mg | INTRAMUSCULAR | Status: DC
  Administered 2011-05-09: 100 mg via INTRAVENOUS
  Filled 2011-05-09: qty 2

## 2011-05-23 ENCOUNTER — Encounter (HOSPITAL_COMMUNITY)
Admission: RE | Admit: 2011-05-23 | Discharge: 2011-05-23 | Disposition: A | Discharge status: Home / Self Care | Payer: 59 | Source: Ambulatory Visit | Attending: Rheumatology | Admitting: Rheumatology

## 2011-05-23 MED ORDER — SODIUM CHLORIDE 0.9 % IV SOLN
INTRAVENOUS | Status: AC
  Administered 2011-05-23: 09:00:00 via INTRAVENOUS

## 2011-05-23 MED ORDER — SODIUM CHLORIDE 0.9 % IV SOLN
1000.0000 mg | INTRAVENOUS | Status: AC
  Administered 2011-05-23: 1000 mg via INTRAVENOUS
  Filled 2011-05-23: qty 100

## 2011-05-23 MED ORDER — METHYLPREDNISOLONE SODIUM SUCC 125 MG IJ SOLR
100.0000 mg | INTRAMUSCULAR | Status: AC
  Administered 2011-05-23: 100 mg via INTRAVENOUS
  Filled 2011-05-23: qty 2

## 2011-07-31 DIAGNOSIS — N189 Chronic kidney disease, unspecified: Secondary | ICD-10-CM

## 2012-01-29 ENCOUNTER — Other Ambulatory Visit (HOSPITAL_COMMUNITY): Payer: Self-pay | Admitting: *Deleted

## 2012-01-30 ENCOUNTER — Encounter (HOSPITAL_COMMUNITY)
Admission: RE | Admit: 2012-01-30 | Discharge: 2012-01-30 | Disposition: A | Discharge status: Home / Self Care | Payer: 59 | Source: Ambulatory Visit | Attending: Rheumatology | Admitting: Rheumatology

## 2012-01-30 DIAGNOSIS — M069 Rheumatoid arthritis, unspecified: Secondary | ICD-10-CM | POA: Insufficient documentation

## 2012-01-30 MED ORDER — METHYLPREDNISOLONE SODIUM SUCC 125 MG IJ SOLR
INTRAMUSCULAR | Status: AC
  Administered 2012-01-30: 100 mg via INTRAVENOUS
  Filled 2012-01-30: qty 2

## 2012-01-30 MED ORDER — SODIUM CHLORIDE 0.9 % IV SOLN
INTRAVENOUS | Status: DC
  Administered 2012-01-30: 250 mL via INTRAVENOUS

## 2012-01-30 MED ORDER — SODIUM CHLORIDE 0.9 % IV SOLN
1000.0000 mg | INTRAVENOUS | Status: DC
  Administered 2012-01-30: 1000 mg via INTRAVENOUS
  Filled 2012-01-30: qty 100

## 2012-01-30 MED ORDER — METHYLPREDNISOLONE SODIUM SUCC 125 MG IJ SOLR
100.0000 mg | INTRAMUSCULAR | Status: DC
  Administered 2012-01-30: 100 mg via INTRAVENOUS

## 2012-02-13 ENCOUNTER — Encounter (HOSPITAL_COMMUNITY)
Admission: RE | Admit: 2012-02-13 | Discharge: 2012-02-13 | Disposition: A | Discharge status: Home / Self Care | Payer: 59 | Source: Ambulatory Visit | Attending: Rheumatology | Admitting: Rheumatology

## 2012-02-13 MED ORDER — METHYLPREDNISOLONE SODIUM SUCC 125 MG IJ SOLR
INTRAMUSCULAR | Status: AC
  Filled 2012-02-13: qty 2

## 2012-02-13 MED ORDER — METHYLPREDNISOLONE SODIUM SUCC 125 MG IJ SOLR
100.0000 mg | INTRAMUSCULAR | Status: AC
  Administered 2012-02-13: 100 mg via INTRAVENOUS

## 2012-02-13 MED ORDER — SODIUM CHLORIDE 0.9 % IV SOLN
1000.0000 mg | INTRAVENOUS | Status: AC
  Administered 2012-02-13: 1000 mg via INTRAVENOUS
  Filled 2012-02-13: qty 100

## 2012-02-13 MED ORDER — SODIUM CHLORIDE 0.9 % IV SOLN
INTRAVENOUS | Status: AC
  Administered 2012-02-13: 09:00:00 via INTRAVENOUS

## 2013-03-19 ENCOUNTER — Encounter (HOSPITAL_COMMUNITY): Payer: 59

## 2013-04-01 ENCOUNTER — Other Ambulatory Visit (HOSPITAL_COMMUNITY): Payer: Self-pay | Admitting: *Deleted

## 2013-04-02 ENCOUNTER — Encounter (HOSPITAL_COMMUNITY)
Admission: RE | Admit: 2013-04-02 | Discharge: 2013-04-02 | Disposition: A | Discharge status: Home / Self Care | Payer: 59 | Source: Ambulatory Visit | Attending: Rheumatology | Admitting: Rheumatology

## 2013-04-02 DIAGNOSIS — M069 Rheumatoid arthritis, unspecified: Secondary | ICD-10-CM | POA: Insufficient documentation

## 2013-04-02 MED ORDER — METHYLPREDNISOLONE SODIUM SUCC 125 MG IJ SOLR
100.0000 mg | Freq: Once | INTRAMUSCULAR | Status: AC
  Administered 2013-04-02: 100 mg via INTRAVENOUS

## 2013-04-02 MED ORDER — METHYLPREDNISOLONE SODIUM SUCC 125 MG IJ SOLR
INTRAMUSCULAR | Status: AC
  Filled 2013-04-02: qty 2

## 2013-04-02 MED ORDER — SODIUM CHLORIDE 0.9 % IV SOLN
INTRAVENOUS | Status: DC
  Administered 2013-04-02: 09:00:00 via INTRAVENOUS

## 2013-04-02 MED ORDER — SODIUM CHLORIDE 0.9 % IV SOLN
1000.0000 mg | Freq: Once | INTRAVENOUS | Status: AC
  Administered 2013-04-02: 1000 mg via INTRAVENOUS
  Filled 2013-04-02: qty 100

## 2013-04-02 MED ORDER — LORATADINE 10 MG PO TABS: 10.0000 mg | ORAL_TABLET | Freq: Every day | ORAL | Status: DC

## 2013-04-16 ENCOUNTER — Encounter (HOSPITAL_COMMUNITY)
Admission: RE | Admit: 2013-04-16 | Discharge: 2013-04-16 | Disposition: A | Discharge status: Home / Self Care | Payer: 59 | Source: Ambulatory Visit | Attending: Rheumatology | Admitting: Rheumatology

## 2013-04-16 MED ORDER — SODIUM CHLORIDE 0.9 % IV SOLN
1000.0000 mg | Freq: Once | INTRAVENOUS | Status: AC
  Administered 2013-04-16: 1000 mg via INTRAVENOUS
  Filled 2013-04-16: qty 100

## 2013-04-16 MED ORDER — LORATADINE 10 MG PO TABS: 10.0000 mg | ORAL_TABLET | Freq: Every day | ORAL | Status: DC

## 2013-04-16 MED ORDER — METHYLPREDNISOLONE SODIUM SUCC 125 MG IJ SOLR
100.0000 mg | Freq: Once | INTRAMUSCULAR | Status: AC
  Administered 2013-04-16: 100 mg via INTRAVENOUS

## 2013-04-16 MED ORDER — METHYLPREDNISOLONE SODIUM SUCC 125 MG IJ SOLR
INTRAMUSCULAR | Status: AC
  Administered 2013-04-16: 100 mg via INTRAVENOUS
  Filled 2013-04-16: qty 2

## 2013-04-16 MED ORDER — SODIUM CHLORIDE 0.9 % IV SOLN
INTRAVENOUS | Status: DC
  Administered 2013-04-16: 250 mL via INTRAVENOUS

## 2014-03-24 LAB — HEMOGLOBIN A1C: Hemoglobin A1C: 8.5

## 2014-10-06 ENCOUNTER — Ambulatory Visit (INDEPENDENT_AMBULATORY_CARE_PROVIDER_SITE_OTHER): Payer: 59 | Admitting: Orthopedic Surgery

## 2014-10-06 ENCOUNTER — Encounter: Payer: Self-pay | Admitting: Orthopedic Surgery

## 2014-10-06 VITALS — BP 150/80 | Ht 61.0 in | Wt 184.0 lb

## 2014-10-06 DIAGNOSIS — M7532 Calcific tendinitis of left shoulder: Secondary | ICD-10-CM | POA: Diagnosis not present

## 2014-10-06 NOTE — Progress Notes (Signed)
Patient ID: [name], female   DOB: March 07, 1955, 59 y.o.   MRN: [id number]  Consult request Dr. Rory Percy [facility]  [facility]  Reason for presentation severe pain left shoulder after fall about 6 months ago Chief Complaint  Patient presents with  . Shoulder Pain    Left shoulder pain, no injury. Referred by Dr. Nadara Mustard for Consult.     [name] is a 59 y.o. female.   HPI 59 year old female fell about 6 months ago presented to her doctor with pain stiffness and sharp throbbing burning stabbing aching pain in the left shoulder associated with some numbness and tingling. Her pain became constant. She rated it 9 out of 10. She has come finding variables of diabetes and rheumatoid arthritis  She had x-rays and MRI. To date she has not had injection and physical therapy  She comes for evaluation and treatment.  Weakness is not a major complaint although she has lost motion in the left shoulder  Review of systems night sweats and fatigue sinus problems vision problems from diabetes shortness of breath cough ankle leg edema back pain swollen joint stiffness joints muscle weakness limb pain joint pain lightheadedness weakness burning pain in her legs diabetic fibromyalgia tingling numbness weakness other systems were reviewed and were normal  She has a history of diabetes pneumonia GERD blood clots hypertension arthritis  She's had multiple operations she's allergic to latex  Family history diabetes COPD reflux liver disease hypertension heart attack kidney disease cancer arthritis  Does not smoke or drink  Surgeries appendectomy tonsillectomy cyst removed from her neck to from her left breast hysterectomy laser surgery on left eye for glaucoma bleeding hemorrhoids colonoscopy hemorrhoidectomy Review of Systems See hpi  Past Medical History  Diagnosis Date  . Bradycardia     History reviewed. No pertinent past surgical  history.  History reviewed. No pertinent family history.  Social History Social History  Substance Use Topics  . Smoking status: None  . Smokeless tobacco: None  . Alcohol Use: None    No Known Allergies  Current Outpatient Prescriptions  Medication Sig Dispense Refill  . acetaminophen (TYLENOL) 500 MG tablet Take 500 mg by mouth every 6 (six) hours as needed.    . DULoxetine (CYMBALTA) 60 MG capsule Take 60 mg by mouth daily.    Marland Kitchen ezetimibe (ZETIA) 10 MG tablet Take 10 mg by mouth daily.    . folic acid (FOLVITE) 1 MG tablet Take 1 mg by mouth daily.    . insulin glargine (LANTUS) 100 UNIT/ML injection Inject 35 Units into the skin at bedtime.    . insulin lispro (HUMALOG) 100 UNIT/ML injection Inject into the skin 3 (three) times daily before meals. Sliding scale    . Liraglutide (VICTOZA) 18 MG/3ML SOPN Inject 1.8 mg into the skin every morning.    . metFORMIN (GLUCOPHAGE) 1000 MG tablet Take 1,200 mg by mouth 2 (two) times daily with a meal.    . methotrexate (RHEUMATREX) 2.5 MG tablet Take 2.5 mg by mouth once a week. Caution:Chemotherapy. Protect from light. Takes 7 tablets every Monday night only    . olmesartan-hydrochlorothiazide (BENICAR HCT) 40-25 MG per tablet Take 1 tablet by mouth daily. 1/2 tab daily    . pantoprazole (PROTONIX) 40 MG tablet Take 40 mg by mouth 2 (two) times daily.    . predniSONE (DELTASONE) 5 MG tablet Take 5 mg by mouth daily with breakfast.    . pregabalin (LYRICA)  150 MG capsule Take 150 mg by mouth 2 (two) times daily.    . Vitamin D, Ergocalciferol, (DRISDOL) 50000 UNITS CAPS capsule Take 50,000 Units by mouth every 7 (seven) days. Every Monday    . warfarin (COUMADIN) 7.5 MG tablet Take 7.5 mg by mouth daily. Except Thursdays 9 mg every Thrusday     No current facility-administered medications for this visit.       Physical Exam Blood pressure 150/80, height 5\' 1"  (1.549 m), weight 184 lb (83.462 kg). Physical Exam The patient is well  developed well nourished and well groomed. Orientation to person place and time is normal  Mood is pleasant. Ambulatory status she walked in no assistive devices  Right shoulder full range of motion stability strength alignment no tenderness neck nontender except on the left side where she has some super scapular tenderness  Left shoulder range of motion external rotation was normal  Forward elevation was painful and she could lift her arm up to 120 I could lift 250  Impingement sign was positive  Shoulder was stable no weakness. Skin was intact sensation was normal she had good pulses and lymph nodes were negative   Data Reviewed I reviewed a lot of data her history from her doctor's notes her medication list her medical history her MRI personally reviewed I interpreted that as no tear but calcification of the rotator cuff her x-ray shows before meals joint arthritis report also reviewed    Assessment Calcific tendinitis left shoulder Plan Inject left shoulder and physical therapy  Procedure note the subacromial injection shoulder left   Verbal consent was obtained to inject the  Left   Shoulder  Timeout was completed to confirm the injection site is a subacromial space of the  left  shoulder  Medication used Depo-Medrol 40 mg and lidocaine 1% 3 cc  Anesthesia was provided by ethyl chloride  The injection was performed in the left  posterior subacromial space. After pinning the skin with alcohol and anesthetized the skin with ethyl chloride the subacromial space was injected using a 20-gauge needle. There were no complications  Sterile dressing was applied.

## 2014-10-06 NOTE — Patient Instructions (Signed)
Call [facility] outpatient therapy dept to schedule therapy visits

## 2014-10-07 NOTE — Addendum Note (Signed)
Addended by: Baldomero Lamy B on: 10/07/2014 09:05 AM   Modules accepted: Medications

## 2014-10-07 NOTE — Addendum Note (Signed)
Addended by: Baldomero Lamy B on: 10/07/2014 09:14 AM   Modules accepted: Medications

## 2014-11-07 LAB — HEMOGLOBIN A1C: Hemoglobin A1C: 8

## 2014-12-06 ENCOUNTER — Ambulatory Visit (INDEPENDENT_AMBULATORY_CARE_PROVIDER_SITE_OTHER): Payer: 59 | Admitting: Orthopedic Surgery

## 2014-12-06 VITALS — BP 151/80 | Ht 61.0 in | Wt 187.0 lb

## 2014-12-06 DIAGNOSIS — M7532 Calcific tendinitis of left shoulder: Secondary | ICD-10-CM

## 2014-12-06 NOTE — Progress Notes (Signed)
Patient ID: [name], female   DOB: 1955/03/20, 59 y.o.   MRN: [id number]  Follow up visit  Chief Complaint  Patient presents with  . Follow-up    2 month follow up left shoulder pain s/p injection + therapy    BP 151/80 mmHg  Ht 5\' 1"  (1.549 m)  Wt 187 lb (84.823 kg)  BMI 35.35 kg/m2   LAST VISIT   Consult request Dr. Rory Percy [facility]  [facility]  Reason for presentation severe pain left shoulder after fall about 6 months ago Chief Complaint  Patient presents with  . Shoulder Pain    Left shoulder pain, no injury. Referred by Dr. Nadara Mustard for Consult.     [name] is a 59 y.o. female.  HPI 59 year old female fell about 6 months ago presented to her doctor with pain stiffness and sharp throbbing burning stabbing aching pain in the left shoulder associated with some numbness and tingling. Her pain became constant. She rated it 9 out of 10. She has come finding variables of diabetes and rheumatoid arthritis  She had x-rays and MRI. To date she has not had injection and physical therapy      Assessment Calcific tendinitis left shoulder Plan Inject left shoulder and physical therapy   The patient has excellent motion just pain daily but only occasionally during the day depending on the movement  Denies numbness tingling or weakness  Exam shows full range of motion in all planes no specific tenderness around the shoulder excellent motor function stable and in external rotation and abduction normal neurovascular exam Procedure note the subacromial injection shoulder left   Verbal consent was obtained to inject the  Left   Shoulder  Timeout was completed to confirm the injection site is a subacromial space of the  left  shoulder  Medication used Depo-Medrol 40 mg and lidocaine 1% 3 cc  Anesthesia was provided by ethyl chloride  The injection was performed in the left  posterior subacromial space.  After pinning the skin with alcohol and anesthetized the skin with ethyl chloride the subacromial space was injected using a 20-gauge needle. There were no complications  Sterile dressing was applied.          Calcific tendinitis recommend repeat injection continue therapy follow-up as needed

## 2014-12-06 NOTE — Patient Instructions (Signed)
Continue home exercises  Joint Injection Care After Refer to this sheet in the next few days. These instructions provide you with information on caring for yourself after you have had a joint injection. Your caregiver also may give you more specific instructions. Your treatment has been planned according to current medical practices, but problems sometimes occur. Call your caregiver if you have any problems or questions after your procedure. After any type of joint injection, it is not uncommon to experience:  Soreness, swelling, or bruising around the injection site.  Mild numbness, tingling, or weakness around the injection site caused by the numbing medicine used before or with the injection. It also is possible to experience the following effects associated with the specific agent after injection:  Iodine-based contrast agents:  Allergic reaction (itching, hives, widespread redness, and swelling beyond the injection site).  Corticosteroids (These effects are rare.):  Allergic reaction.  Increased blood sugar levels (If you have diabetes and you notice that your blood sugar levels have increased, notify your caregiver).  Increased blood pressure levels.  Mood swings.  Hyaluronic acid in the use of viscosupplementation.  Temporary heat or redness.  Temporary rash and itching.  Increased fluid accumulation in the injected joint. These effects all should resolve within a day after your procedure.  HOME CARE INSTRUCTIONS  Limit yourself to light activity the day of your procedure. Avoid lifting heavy objects, bending, stooping, or twisting.  Take prescription or over-the-counter pain medication as directed by your caregiver.  You may apply ice to your injection site to reduce pain and swelling the day of your procedure. Ice may be applied 03-04 times:  Put ice in a plastic bag.  Place a towel between your skin and the bag.  Leave the ice on for no longer than 15-20  minutes each time. SEEK IMMEDIATE MEDICAL CARE IF:   Pain and swelling get worse rather than better or extend beyond the injection site.  Numbness does not go away.  Blood or fluid continues to leak from the injection site.  You have chest pain.  You have swelling of your face or tongue.  You have trouble breathing or you become dizzy.  You develop a fever, chills, or severe tenderness at the injection site that last longer than 1 day. MAKE SURE YOU:  Understand these instructions.  Watch your condition.  Get help right away if you are not doing well or if you get worse. Document Released: 10/25/2010 Document Revised: 05/06/2011 Document Reviewed: 10/25/2010 ExitCare Patient Information 2015 [employer]. This information is not intended to replace advice given to you by your health care provider. Make sure you discuss any questions you have with your health care provider.  

## 2014-12-07 ENCOUNTER — Other Ambulatory Visit: Payer: Self-pay | Admitting: "Endocrinology

## 2014-12-28 ENCOUNTER — Other Ambulatory Visit: Payer: Self-pay | Admitting: "Endocrinology

## 2015-01-10 ENCOUNTER — Other Ambulatory Visit: Payer: Self-pay | Admitting: "Endocrinology

## 2015-01-26 ENCOUNTER — Other Ambulatory Visit: Payer: Self-pay | Admitting: "Endocrinology

## 2015-02-01 ENCOUNTER — Other Ambulatory Visit: Payer: Self-pay | Admitting: "Endocrinology

## 2015-02-09 ENCOUNTER — Ambulatory Visit: Payer: Self-pay | Admitting: "Endocrinology

## 2015-03-02 ENCOUNTER — Ambulatory Visit (INDEPENDENT_AMBULATORY_CARE_PROVIDER_SITE_OTHER): Payer: 59 | Admitting: "Endocrinology

## 2015-03-02 ENCOUNTER — Encounter: Payer: Self-pay | Admitting: "Endocrinology

## 2015-03-02 VITALS — BP 137/80 | HR 84 | Ht 63.0 in | Wt 188.0 lb

## 2015-03-02 DIAGNOSIS — E785 Hyperlipidemia, unspecified: Secondary | ICD-10-CM | POA: Diagnosis not present

## 2015-03-02 DIAGNOSIS — E1165 Type 2 diabetes mellitus with hyperglycemia: Secondary | ICD-10-CM

## 2015-03-02 DIAGNOSIS — E118 Type 2 diabetes mellitus with unspecified complications: Secondary | ICD-10-CM

## 2015-03-02 DIAGNOSIS — Z794 Long term (current) use of insulin: Secondary | ICD-10-CM

## 2015-03-02 DIAGNOSIS — IMO0002 Reserved for concepts with insufficient information to code with codable children: Secondary | ICD-10-CM | POA: Insufficient documentation

## 2015-03-02 DIAGNOSIS — E038 Other specified hypothyroidism: Secondary | ICD-10-CM | POA: Diagnosis not present

## 2015-03-02 DIAGNOSIS — I10 Essential (primary) hypertension: Secondary | ICD-10-CM

## 2015-03-02 MED ORDER — LEVOTHYROXINE SODIUM 100 MCG PO TABS: 100.0000 ug | ORAL_TABLET | Freq: Every day | ORAL | Status: DC

## 2015-03-02 MED ORDER — INSULIN GLARGINE 100 UNIT/ML ~~LOC~~ SOLN: 48.0000 [IU] | Freq: Every day | SUBCUTANEOUS | Status: DC

## 2015-03-02 NOTE — Patient Instructions (Signed)

## 2015-03-02 NOTE — Progress Notes (Signed)
Subjective:    Patient ID: [name], female    DOB: 1955/04/15, PCP Rory Percy, MD   Past Medical History  Diagnosis Date  . Bradycardia    Past Surgical History  Procedure Laterality Date  . Tonsillectomy    . Abdominal hysterectomy    . Hemorhoidectomy    . Appendectomy    . Neck surgery    . Refractive surgery     Social History   Social History  . Marital Status: Married    Spouse Name: N/A  . Number of Children: N/A  . Years of Education: N/A   Social History Main Topics  . Smoking status: Former Research scientist (life sciences)  . Smokeless tobacco: None  . Alcohol Use: No  . Drug Use: No  . Sexual Activity: Not Asked   Other Topics Concern  . None   Social History Narrative   Outpatient Encounter Prescriptions as of 03/02/2015  Medication Sig  . acetaminophen (TYLENOL) 500 MG tablet Take 500 mg by mouth every 6 (six) hours as needed.  Marland Kitchen apixaban (ELIQUIS) 5 MG TABS tablet Take 5 mg by mouth 2 (two) times daily.  . Calcium Citrate-Vitamin D (CALCIUM + D PO) Take by mouth.  . docusate sodium (COLACE) 100 MG capsule Take 100 mg by mouth daily.  . DULoxetine (CYMBALTA) 60 MG capsule Take 60 mg by mouth daily.  . folic acid (FOLVITE) 1 MG tablet Take 1 mg by mouth daily.  . insulin glargine (LANTUS) 100 UNIT/ML injection Inject 0.48 mLs (48 Units total) into the skin at bedtime.  . insulin lispro (HUMALOG) 100 UNIT/ML injection Inject 15-21 Units into the skin 3 (three) times daily before meals.  Marland Kitchen levothyroxine (SYNTHROID, LEVOTHROID) 100 MCG tablet Take 1 tablet (100 mcg total) by mouth daily before breakfast.  . metFORMIN (GLUCOPHAGE) 1000 MG tablet Take 1,200 mg by mouth 2 (two) times daily with a meal.  . methotrexate (RHEUMATREX) 2.5 MG tablet Take 2.5 mg by mouth once a week. Caution:Chemotherapy. Protect from light. Takes 7 tablets every Monday night only  . olmesartan-hydrochlorothiazide (BENICAR HCT) 40-25 MG per tablet Take 1 tablet by mouth daily. 1/2 tab  daily  . pantoprazole (PROTONIX) 40 MG tablet Take 40 mg by mouth 2 (two) times daily.  . predniSONE (DELTASONE) 5 MG tablet Take 5 mg by mouth daily with breakfast.  . pregabalin (LYRICA) 150 MG capsule Take 150 mg by mouth 2 (two) times daily.  Marland Kitchen VICTOZA 18 MG/3ML SOPN INJECT 1.8MG SUBCUTANEOUSLY EVERY DAY  . Vitamin D, Ergocalciferol, (DRISDOL) 50000 UNITS CAPS capsule TAKE ONE CAPSULE BY MOUTH EVERY WEEK  . ZETIA 10 MG tablet TAKE ONE TABLET BY MOUTH AT BEDTIME.  . [DISCONTINUED] insulin glargine (LANTUS) 100 UNIT/ML injection Inject 48 Units into the skin at bedtime.  . [DISCONTINUED] levothyroxine (SYNTHROID, LEVOTHROID) 88 MCG tablet TAKE ONE TABLET BY MOUTH IN THE MORNING   No facility-administered encounter medications on file as of 03/02/2015.   ALLERGIES: No Known Allergies VACCINATION STATUS:  There is no immunization history on file for this patient.  Diabetes She presents for her follow-up diabetic visit. She has type 2 diabetes mellitus. Onset time: She was diagnosed at approximate age of 53 years. Her disease course has been worsening. There are no hypoglycemic associated symptoms. Pertinent negatives for hypoglycemia include no confusion, headaches, pallor or seizures. Associated symptoms include fatigue and polydipsia. Pertinent negatives for diabetes include no chest pain, no polyphagia and no polyuria. There are no hypoglycemic complications. Symptoms  are worsening. Risk factors for coronary artery disease include diabetes mellitus, dyslipidemia, hypertension, obesity, sedentary lifestyle and tobacco exposure. Current diabetic treatment includes insulin injections and oral agent (monotherapy). She is compliant with treatment most of the time. Her weight is stable. She is following a diabetic diet. When asked about meal planning, she reported none. She has not had a previous visit with a dietitian (She declined referral to a dietitian.). Her breakfast blood glucose range is  generally 180-200 mg/dl. Her lunch blood glucose range is generally 180-200 mg/dl. Her dinner blood glucose range is generally 140-180 mg/dl. Her overall blood glucose range is 180-200 mg/dl. Eye exam is current.  Hyperlipidemia This is a chronic problem. The current episode started more than 1 year ago. Pertinent negatives include no chest pain, myalgias or shortness of breath. Current antihyperlipidemic treatment includes bile acid squestrants. Risk factors for coronary artery disease include diabetes mellitus, dyslipidemia, hypertension, obesity and a sedentary lifestyle.  Hypertension This is a chronic problem. The current episode started more than 1 year ago. The problem is controlled. Pertinent negatives include no chest pain, headaches, palpitations or shortness of breath. Past treatments include angiotensin blockers. Hypertensive end-organ damage includes a thyroid problem.  Thyroid Problem Presents for follow-up visit. Symptoms include fatigue. Patient reports no cold intolerance, diarrhea, heat intolerance or palpitations. The symptoms have been stable. Past treatments include levothyroxine. The following procedures have not been performed: thyroidectomy. Her past medical history is significant for hyperlipidemia.     Review of Systems  Constitutional: Positive for fatigue. Negative for fever, chills and unexpected weight change.  HENT: Negative for trouble swallowing and voice change.   Eyes: Negative for visual disturbance.  Respiratory: Negative for cough, shortness of breath and wheezing.   Cardiovascular: Negative for chest pain, palpitations and leg swelling.  Gastrointestinal: Negative for nausea, vomiting and diarrhea.  Endocrine: Positive for polydipsia. Negative for cold intolerance, heat intolerance, polyphagia and polyuria.  Musculoskeletal: Negative for myalgias and arthralgias.  Skin: Negative for color change, pallor, rash and wound.  Neurological: Negative for seizures  and headaches.  Psychiatric/Behavioral: Negative for suicidal ideas and confusion.    Objective:    BP 137/80 mmHg  Pulse 84  Ht '5\' 3"'  (1.6 m)  Wt 188 lb (85.276 kg)  BMI 33.31 kg/m2  SpO2 96%  Wt Readings from Last 3 Encounters:  03/02/15 188 lb (85.276 kg)  12/06/14 187 lb (84.823 kg)  10/06/14 184 lb (83.462 kg)    Physical Exam  Constitutional: She is oriented to person, place, and time. She appears well-developed.  HENT:  Head: Normocephalic and atraumatic.  Eyes: EOM are normal.  Neck: Normal range of motion. Neck supple. No tracheal deviation present. No thyromegaly present.  Cardiovascular: Normal rate and regular rhythm.   Pulmonary/Chest: Effort normal and breath sounds normal.  Abdominal: Soft. Bowel sounds are normal. There is no tenderness. There is no guarding.  Musculoskeletal: Normal range of motion. She exhibits no edema.  Neurological: She is alert and oriented to person, place, and time. She has normal reflexes. No cranial nerve deficit. Coordination normal.  Skin: Skin is warm and dry. No rash noted. No erythema. No pallor.  Psychiatric: She has a normal mood and affect. Judgment normal.     CMP ( most recent) CMP     Component Value Date/Time   NA 130* 06/12/2009 1155   K 4.0 06/12/2009 1155   CL 94* 06/12/2009 1155   CO2 23 06/12/2009 1155   GLUCOSE 499* 06/12/2009 1155  BUN 18 06/12/2009 1155   CREATININE 0.98 06/12/2009 1155   CALCIUM 9.7 06/12/2009 1155   PROT 6.4 01/20/2008 2225   ALBUMIN 2.9* 01/20/2008 2225   AST 15 01/20/2008 2225   ALT 22 01/20/2008 2225   ALKPHOS 66 01/20/2008 2225   BILITOT 0.7 01/20/2008 2225   GFRNONAA 60* 06/12/2009 1155   GFRAA  06/12/2009 1155    >60        The eGFR has been calculated using the MDRD equation. This calculation has not been validated in all clinical situations. eGFR's persistently <60 mL/min signify possible Chronic Kidney Disease.     Diabetic Labs (most recent): Lab Results   Component Value Date   HGBA1C 8 11/07/2014   HGBA1C 8.5 03/24/2014   HGBA1C * 01/21/2008    7.6 (NOTE)   The ADA recommends the following therapeutic goal for glycemic   control related to Hgb A1C measurement:   Goal of Therapy:   < 7.0% Hgb A1C   Reference: American Diabetes Association: Clinical Practice   Recommendations 2008, Diabetes Care,  2008, 31:(Suppl 1).     Lipid Panel ( most recent) Lipid Panel     Component Value Date/Time   CHOL  01/21/2008 0636    174        ATP III CLASSIFICATION:  <200     mg/dL   Desirable  200-239  mg/dL   Borderline High  >=240    mg/dL   High   TRIG 168* 01/21/2008 0636   HDL 28* 01/21/2008 0636   CHOLHDL 6.2 01/21/2008 0636   VLDL 34 01/21/2008 0636   LDLCALC * 01/21/2008 0636    112        Total Cholesterol/HDL:CHD Risk Coronary Heart Disease Risk Table                     Men   Women  1/2 Average Risk   3.4   3.3            Assessment & Plan:   1. Uncontrolled type 2 diabetes mellitus with complication, with long-term current use of insulin ([place])  - patient remains at a high risk for more acute and chronic complications of diabetes which include CAD, CVA, CKD, retinopathy, and neuropathy. These are all discussed in detail with the patient.  Patient came with above target glucose profile, and  recent A1c of 8.5 %.  Glucose logs and insulin administration records pertaining to this visit,  to be scanned into patient's records.  Recent labs reviewed.   - I have re-counseled the patient on diet management and weight loss  by adopting a carbohydrate restricted / protein rich  Diet.  - Suggestion is made for patient to avoid simple carbohydrates   from their diet including Cakes , Desserts, Ice Cream,  Soda (  diet and regular) , Sweet Tea , Candies,  Chips, Cookies, Artificial Sweeteners,   and "Sugar-free" Products .  This will help patient to have stable blood glucose profile and potentially avoid unintended  Weight gain.  -  Patient is advised to stick to a routine mealtimes to eat 3 meals  a day and avoid unnecessary snacks ( to snack only to correct hypoglycemia).  - The patient  has been  scheduled with Jearld Fenton, RDN, CDE for individualized DM education.  - I have approached patient with the following individualized plan to manage diabetes and patient agrees. -Increase Lantus to 48 units qhs, and continue Humalog 15 units [facility]  for premeal BG readings of 90-19m/dl, plus patient specific sliding scale insulin for correction of unexpected hyperglycemia above 151mdl, associated with strict monitoring of BG AC and HS.  -Adjustment parameters for hypo and hyperglycemia were given in a written document to patient. -Patient is encouraged to call clinic for blood glucose levels less than 70 or above 300 mg /dl. -I will continue Metformin 1gm PO BID. - I will continue Victoza 1.8 mg sq daily. SE discussed with the patient.   - Patient specific target  for A1c; LDL, HDL, Triglycerides, and  Waist Circumference were discussed in detail.  2) BP/HTN: Controlled. Continue current medications including ACEI/ARB. 3) Lipids/HPL:  continue Zetia, with cocaine fasting lipid panel on next visit. She has statin intolerance. 4)  Weight/Diet: CDE consult in progress, exercise, and carbohydrates information provided.  5) hypothyroidism: Would increase her levothyroxine to 100 g by mouth every morning.  - We discussed about correct intake of levothyroxine, at fasting, with water, separated by at least 30 minutes from breakfast, and separated by more than 4 hours from calcium, iron, multivitamins, acid reflux medications (PPIs). -Patient is made aware of the fact that thyroid hormone replacement is needed for life, dose to be adjusted by periodic monitoring of thyroid function tests.  6) Chronic Care/Health Maintenance:  -Patient  is on ARB  medications and encouraged to continue to follow up with Ophthalmology, Podiatrist  at least yearly or according to recommendations, and advised to  stay away from smoking. I have recommended yearly flu vaccine and pneumonia vaccination at least every 5 years; moderate intensity exercise for up to 150 minutes weekly; and  sleep for at least 7 hours a day.  - 25 minutes of time was spent on the care of this patient , 50% of which was applied for counseling on diabetes complications and their preventions.  - I advised patient to maintain close follow up with HORory PercyMD for primary care needs.  Patient is asked to bring meter and  blood glucose logs during their next visit.   Follow up plan: -Return in about 3 months (around 05/31/2015) for diabetes, high blood pressure, high cholesterol, underactive thyroid, follow up with pre-visit labs, meter, and logs.  GeGlade LloydMD Phone: [phone]: [phone] 03/02/2015, 9:48 PM

## 2015-03-13 ENCOUNTER — Encounter: Payer: Self-pay | Admitting: "Endocrinology

## 2015-03-31 ENCOUNTER — Other Ambulatory Visit: Payer: Self-pay | Admitting: "Endocrinology

## 2015-05-01 ENCOUNTER — Other Ambulatory Visit: Payer: Self-pay | Admitting: "Endocrinology

## 2015-06-01 ENCOUNTER — Ambulatory Visit: Payer: 59 | Admitting: "Endocrinology

## 2015-06-14 LAB — HEMOGLOBIN A1C: HEMOGLOBIN A1C: 9.8

## 2015-06-22 ENCOUNTER — Ambulatory Visit (INDEPENDENT_AMBULATORY_CARE_PROVIDER_SITE_OTHER): Payer: 59 | Admitting: "Endocrinology

## 2015-06-22 ENCOUNTER — Encounter: Payer: Self-pay | Admitting: "Endocrinology

## 2015-06-22 VITALS — BP 142/77 | HR 88 | Ht 63.0 in | Wt 188.0 lb

## 2015-06-22 DIAGNOSIS — Z794 Long term (current) use of insulin: Secondary | ICD-10-CM

## 2015-06-22 DIAGNOSIS — E1165 Type 2 diabetes mellitus with hyperglycemia: Secondary | ICD-10-CM

## 2015-06-22 DIAGNOSIS — E038 Other specified hypothyroidism: Secondary | ICD-10-CM | POA: Diagnosis not present

## 2015-06-22 DIAGNOSIS — E118 Type 2 diabetes mellitus with unspecified complications: Secondary | ICD-10-CM

## 2015-06-22 DIAGNOSIS — E785 Hyperlipidemia, unspecified: Secondary | ICD-10-CM | POA: Diagnosis not present

## 2015-06-22 DIAGNOSIS — I10 Essential (primary) hypertension: Secondary | ICD-10-CM

## 2015-06-22 DIAGNOSIS — IMO0002 Reserved for concepts with insufficient information to code with codable children: Secondary | ICD-10-CM

## 2015-06-22 NOTE — Patient Instructions (Signed)

## 2015-06-22 NOTE — Progress Notes (Signed)
Subjective:    Patient ID: [name], female    DOB: September 28, 1955, PCP Rory Percy, MD   Past Medical History  Diagnosis Date  . Bradycardia    Past Surgical History  Procedure Laterality Date  . Tonsillectomy    . Abdominal hysterectomy    . Hemorhoidectomy    . Appendectomy    . Neck surgery    . Refractive surgery     Social History   Social History  . Marital Status: Married    Spouse Name: N/A  . Number of Children: N/A  . Years of Education: N/A   Social History Main Topics  . Smoking status: Former Research scientist (life sciences)  . Smokeless tobacco: None  . Alcohol Use: No  . Drug Use: No  . Sexual Activity: Not Asked   Other Topics Concern  . None   Social History Narrative   Outpatient Encounter Prescriptions as of 06/22/2015  Medication Sig  . Insulin Glargine (LANTUS [place]) Inject 52 Units into the skin at bedtime.  Marland Kitchen acetaminophen (TYLENOL) 500 MG tablet Take 500 mg by mouth every 6 (six) hours as needed.  Marland Kitchen apixaban (ELIQUIS) 5 MG TABS tablet Take 5 mg by mouth 2 (two) times daily.  . Calcium Citrate-Vitamin D (CALCIUM + D PO) Take by mouth.  . docusate sodium (COLACE) 100 MG capsule Take 100 mg by mouth daily.  . DULoxetine (CYMBALTA) 60 MG capsule Take 60 mg by mouth daily.  . folic acid (FOLVITE) 1 MG tablet Take 1 mg by mouth daily.  . insulin lispro (HUMALOG) 100 UNIT/ML injection Inject 18-24 Units into the skin 3 (three) times daily before meals.  Marland Kitchen levothyroxine (SYNTHROID, LEVOTHROID) 100 MCG tablet Take 1 tablet (100 mcg total) by mouth daily before breakfast.  . metFORMIN (GLUCOPHAGE) 1000 MG tablet Take 1,200 mg by mouth 2 (two) times daily with a meal.  . methotrexate (RHEUMATREX) 2.5 MG tablet Take 2.5 mg by mouth once a week. Caution:Chemotherapy. Protect from light. Takes 7 tablets every Monday night only  . olmesartan-hydrochlorothiazide (BENICAR HCT) 40-25 MG per tablet Take 1 tablet by mouth daily. 1/2 tab daily  . pantoprazole (PROTONIX)  40 MG tablet Take 40 mg by mouth 2 (two) times daily.  . predniSONE (DELTASONE) 5 MG tablet Take 5 mg by mouth daily with breakfast.  . pregabalin (LYRICA) 150 MG capsule Take 150 mg by mouth 2 (two) times daily.  Marland Kitchen VICTOZA 18 MG/3ML SOPN INJECT 1.8MG SUBCUTANEOUSLY EVERY DAY  . Vitamin D, Ergocalciferol, (DRISDOL) 50000 UNITS CAPS capsule TAKE ONE CAPSULE BY MOUTH EVERY WEEK  . ZETIA 10 MG tablet TAKE ONE TABLET BY MOUTH AT BEDTIME.  . [DISCONTINUED] insulin glargine (LANTUS) 100 UNIT/ML injection Inject 0.48 mLs (48 Units total) into the skin at bedtime.   No facility-administered encounter medications on file as of 06/22/2015.   ALLERGIES: No Known Allergies VACCINATION STATUS:  There is no immunization history on file for this patient.  Diabetes She presents for her follow-up diabetic visit. She has type 2 diabetes mellitus. Onset time: She was diagnosed at approximate age of 8 years. Her disease course has been worsening. There are no hypoglycemic associated symptoms. Pertinent negatives for hypoglycemia include no confusion, headaches, pallor or seizures. Associated symptoms include fatigue and polydipsia. Pertinent negatives for diabetes include no chest pain, no polyphagia and no polyuria. There are no hypoglycemic complications. Symptoms are worsening. Risk factors for coronary artery disease include diabetes mellitus, dyslipidemia, hypertension, obesity, sedentary lifestyle and tobacco exposure.  Current diabetic treatment includes insulin injections and oral agent (monotherapy). She is compliant with treatment most of the time. Her weight is stable. She is following a generally unhealthy diet. When asked about meal planning, she reported none. She has not had a previous visit with a dietitian (She declined referral to a dietitian.). Her breakfast blood glucose range is generally >200 mg/dl. Her lunch blood glucose range is generally >200 mg/dl. Her dinner blood glucose range is generally  >200 mg/dl. Her overall blood glucose range is >200 mg/dl. Eye exam is current.  Hyperlipidemia This is a chronic problem. The current episode started more than 1 year ago. Pertinent negatives include no chest pain, myalgias or shortness of breath. Current antihyperlipidemic treatment includes bile acid squestrants. Risk factors for coronary artery disease include diabetes mellitus, dyslipidemia, hypertension, obesity and a sedentary lifestyle.  Hypertension This is a chronic problem. The current episode started more than 1 year ago. The problem is controlled. Pertinent negatives include no chest pain, headaches, palpitations or shortness of breath. Past treatments include angiotensin blockers. Hypertensive end-organ damage includes a thyroid problem.  Thyroid Problem Presents for follow-up visit. Symptoms include fatigue. Patient reports no cold intolerance, diarrhea, heat intolerance or palpitations. The symptoms have been stable. Past treatments include levothyroxine. The following procedures have not been performed: thyroidectomy. Her past medical history is significant for hyperlipidemia.     Review of Systems  Constitutional: Positive for fatigue. Negative for fever, chills and unexpected weight change.  HENT: Negative for trouble swallowing and voice change.   Eyes: Negative for visual disturbance.  Respiratory: Negative for cough, shortness of breath and wheezing.   Cardiovascular: Negative for chest pain, palpitations and leg swelling.  Gastrointestinal: Negative for nausea, vomiting and diarrhea.  Endocrine: Positive for polydipsia. Negative for cold intolerance, heat intolerance, polyphagia and polyuria.  Musculoskeletal: Negative for myalgias and arthralgias.  Skin: Negative for color change, pallor, rash and wound.  Neurological: Negative for seizures and headaches.  Psychiatric/Behavioral: Negative for suicidal ideas and confusion.    Objective:    BP 142/77 mmHg  Pulse 88   Ht '5\' 3"'  (1.6 m)  Wt 188 lb (85.276 kg)  BMI 33.31 kg/m2  SpO2 96%  Wt Readings from Last 3 Encounters:  06/22/15 188 lb (85.276 kg)  03/02/15 188 lb (85.276 kg)  12/06/14 187 lb (84.823 kg)    Physical Exam  Constitutional: She is oriented to person, place, and time. She appears well-developed.  HENT:  Head: Normocephalic and atraumatic.  Eyes: EOM are normal.  Neck: Normal range of motion. Neck supple. No tracheal deviation present. No thyromegaly present.  Cardiovascular: Normal rate and regular rhythm.   Pulmonary/Chest: Effort normal and breath sounds normal.  Abdominal: Soft. Bowel sounds are normal. There is no tenderness. There is no guarding.  Musculoskeletal: Normal range of motion. She exhibits no edema.  Neurological: She is alert and oriented to person, place, and time. She has normal reflexes. No cranial nerve deficit. Coordination normal.  Skin: Skin is warm and dry. No rash noted. No erythema. No pallor.  Psychiatric: She has a normal mood and affect. Judgment normal.     CMP ( most recent) CMP     Component Value Date/Time   NA 130* 06/12/2009 1155   K 4.0 06/12/2009 1155   CL 94* 06/12/2009 1155   CO2 23 06/12/2009 1155   GLUCOSE 499* 06/12/2009 1155   BUN 18 06/12/2009 1155   CREATININE 0.98 06/12/2009 1155   CALCIUM 9.7 06/12/2009 1155  PROT 6.4 01/20/2008 2225   ALBUMIN 2.9* 01/20/2008 2225   AST 15 01/20/2008 2225   ALT 22 01/20/2008 2225   ALKPHOS 66 01/20/2008 2225   BILITOT 0.7 01/20/2008 2225   GFRNONAA 60* 06/12/2009 1155   GFRAA  06/12/2009 1155    >60        The eGFR has been calculated using the MDRD equation. This calculation has not been validated in all clinical situations. eGFR's persistently <60 mL/min signify possible Chronic Kidney Disease.     Diabetic Labs (most recent): Lab Results  Component Value Date   HGBA1C 9.8 06/14/2015   HGBA1C 8 11/07/2014   HGBA1C 8.5 03/24/2014     Lipid Panel ( most recent) Lipid  Panel     Component Value Date/Time   CHOL  01/21/2008 0636    174        ATP III CLASSIFICATION:  <200     mg/dL   Desirable  200-239  mg/dL   Borderline High  >=240    mg/dL   High   TRIG 168* 01/21/2008 0636   HDL 28* 01/21/2008 0636   CHOLHDL 6.2 01/21/2008 0636   VLDL 34 01/21/2008 0636   LDLCALC * 01/21/2008 0636    112        Total Cholesterol/HDL:CHD Risk Coronary Heart Disease Risk Table                     Men   Women  1/2 Average Risk   3.4   3.3            Assessment & Plan:   1. Uncontrolled type 2 diabetes mellitus with complication, with long-term current use of insulin ([place])  - patient remains at a high risk for more acute and chronic complications of diabetes which include CAD, CVA, CKD, retinopathy, and neuropathy. These are all discussed in detail with the patient.  Patient came with above target glucose profile, and  recent A1c  Increasing to 9.8% .  Glucose logs and insulin administration records pertaining to this visit,  to be scanned into patient's records.  Recent labs reviewed.   - I have re-counseled the patient on diet management and weight loss  by adopting a carbohydrate restricted / protein rich  Diet.  - Suggestion is made for patient to avoid simple carbohydrates   from their diet including Cakes , Desserts, Ice Cream,  Soda (  diet and regular) , Sweet Tea , Candies,  Chips, Cookies, Artificial Sweeteners,   and "Sugar-free" Products .  This will help patient to have stable blood glucose profile and potentially avoid unintended  Weight gain.  - Patient is advised to stick to a routine mealtimes to eat 3 meals  a day and avoid unnecessary snacks ( to snack only to correct hypoglycemia).  - The patient  has been  scheduled with Jearld Fenton, RDN, CDE for individualized DM education.  - I have approached patient with the following individualized plan to manage diabetes and patient agrees. -Increase Lantus to 52 units qhs, and increase   Humalog to 18 units TIDAC for premeal BG readings of 90-181m/dl, plus patient specific sliding scale insulin for correction of unexpected hyperglycemia above 1553mdl, associated with strict monitoring of BG AC and HS.  -Adjustment parameters for hypo and hyperglycemia were given in a written document to patient. -Patient is encouraged to call clinic for blood glucose levels less than 70 or above 300 mg /dl. -I will continue Metformin 1gm PO  BID. - I will continue Victoza 1.8 mg sq daily. SE discussed with the patient.   - Patient specific target  for A1c; LDL, HDL, Triglycerides, and  Waist Circumference were discussed in detail.  2) BP/HTN: Controlled. Continue current medications including ACEI/ARB. 3) Lipids/HPL:  continue Zetia, with cocaine fasting lipid panel on next visit. She has statin intolerance. 4)  Weight/Diet: CDE consult in progress, exercise, and carbohydrates information provided.  5) hypothyroidism: Continue her levothyroxine  100 g by mouth every morning.  - We discussed about correct intake of levothyroxine, at fasting, with water, separated by at least 30 minutes from breakfast, and separated by more than 4 hours from calcium, iron, multivitamins, acid reflux medications (PPIs). -Patient is made aware of the fact that thyroid hormone replacement is needed for life, dose to be adjusted by periodic monitoring of thyroid function tests.  6) Chronic Care/Health Maintenance:  -Patient  is on ARB  medications and encouraged to continue to follow up with Ophthalmology, Podiatrist at least yearly or according to recommendations, and advised to  stay away from smoking. I have recommended yearly flu vaccine and pneumonia vaccination at least every 5 years; moderate intensity exercise for up to 150 minutes weekly; and  sleep for at least 7 hours a day.  - 25 minutes of time was spent on the care of this patient , 50% of which was applied for counseling on diabetes complications and  their preventions.  - I advised patient to maintain close follow up with Rory Percy, MD for primary care needs.  Patient is asked to bring meter and  blood glucose logs during their next visit.   Follow up plan: -Return in about 2 weeks (around 07/06/2015) for diabetes, high blood pressure, high cholesterol, underactive thyroid, meter, and logs, follow up with meter and logs- no labs.  Glade Lloyd, MD Phone: [phone]  Fax: [phone]   06/22/2015, 9:58 AM

## 2015-07-13 ENCOUNTER — Encounter: Payer: Self-pay | Admitting: "Endocrinology

## 2015-07-13 ENCOUNTER — Ambulatory Visit (INDEPENDENT_AMBULATORY_CARE_PROVIDER_SITE_OTHER): Payer: 59 | Admitting: "Endocrinology

## 2015-07-13 VITALS — BP 132/88 | HR 90 | Ht 63.0 in | Wt 188.0 lb

## 2015-07-13 DIAGNOSIS — E785 Hyperlipidemia, unspecified: Secondary | ICD-10-CM

## 2015-07-13 DIAGNOSIS — E1165 Type 2 diabetes mellitus with hyperglycemia: Secondary | ICD-10-CM | POA: Diagnosis not present

## 2015-07-13 DIAGNOSIS — E038 Other specified hypothyroidism: Secondary | ICD-10-CM

## 2015-07-13 DIAGNOSIS — IMO0002 Reserved for concepts with insufficient information to code with codable children: Secondary | ICD-10-CM

## 2015-07-13 DIAGNOSIS — Z794 Long term (current) use of insulin: Secondary | ICD-10-CM

## 2015-07-13 DIAGNOSIS — E118 Type 2 diabetes mellitus with unspecified complications: Secondary | ICD-10-CM

## 2015-07-13 DIAGNOSIS — I10 Essential (primary) hypertension: Secondary | ICD-10-CM

## 2015-07-13 MED ORDER — DULAGLUTIDE 0.75 MG/0.5ML ~~LOC~~ SOAJ: 0.7500 mg | SUBCUTANEOUS | Status: DC

## 2015-07-13 NOTE — Patient Instructions (Signed)

## 2015-07-13 NOTE — Progress Notes (Signed)
Subjective:    Patient ID: [name], female    DOB: [birth year], PCP Rory Percy, MD   Past Medical History  Diagnosis Date  . Bradycardia    Past Surgical History  Procedure Laterality Date  . Tonsillectomy    . Abdominal hysterectomy    . Hemorhoidectomy    . Appendectomy    . Neck surgery    . Refractive surgery     Social History   Social History  . Marital Status: Married    Spouse Name: N/A  . Number of Children: N/A  . Years of Education: N/A   Social History Main Topics  . Smoking status: Former Research scientist (life sciences)  . Smokeless tobacco: None  . Alcohol Use: No  . Drug Use: No  . Sexual Activity: Not Asked   Other Topics Concern  . None   Social History Narrative   Outpatient Encounter Prescriptions as of 07/13/2015  Medication Sig  . acetaminophen (TYLENOL) 500 MG tablet Take 500 mg by mouth every 6 (six) hours as needed.  Marland Kitchen apixaban (ELIQUIS) 5 MG TABS tablet Take 5 mg by mouth 2 (two) times daily.  . Calcium Citrate-Vitamin D (CALCIUM + D PO) Take by mouth.  . docusate sodium (COLACE) 100 MG capsule Take 100 mg by mouth daily.  . Dulaglutide (TRULICITY) 3.73 SK/8.7GO SOPN Inject 0.75 mg into the skin once a week.  . DULoxetine (CYMBALTA) 60 MG capsule Take 60 mg by mouth daily.  . folic acid (FOLVITE) 1 MG tablet Take 1 mg by mouth daily.  . Insulin Glargine (LANTUS ) Inject 52 Units into the skin at bedtime.  . insulin lispro (HUMALOG) 100 UNIT/ML injection Inject 18-24 Units into the skin 3 (three) times daily before meals.  Marland Kitchen levothyroxine (SYNTHROID, LEVOTHROID) 100 MCG tablet Take 1 tablet (100 mcg total) by mouth daily before breakfast.  . metFORMIN (GLUCOPHAGE) 1000 MG tablet Take 1,200 mg by mouth 2 (two) times daily with a meal.  . methotrexate (RHEUMATREX) 2.5 MG tablet Take 2.5 mg by mouth once a week. Caution:Chemotherapy. Protect from light. Takes 7 tablets every Monday night only  . olmesartan-hydrochlorothiazide (BENICAR HCT)  40-25 MG per tablet Take 1 tablet by mouth daily. 1/2 tab daily  . pantoprazole (PROTONIX) 40 MG tablet Take 40 mg by mouth 2 (two) times daily.  . predniSONE (DELTASONE) 5 MG tablet Take 5 mg by mouth daily with breakfast.  . pregabalin (LYRICA) 150 MG capsule Take 150 mg by mouth 2 (two) times daily.  . Vitamin D, Ergocalciferol, (DRISDOL) 50000 UNITS CAPS capsule TAKE ONE CAPSULE BY MOUTH EVERY WEEK  . ZETIA 10 MG tablet TAKE ONE TABLET BY MOUTH AT BEDTIME.  . [DISCONTINUED] VICTOZA 18 MG/3ML SOPN INJECT 1.8MG SUBCUTANEOUSLY EVERY DAY   No facility-administered encounter medications on file as of 07/13/2015.   ALLERGIES: No Known Allergies VACCINATION STATUS:  There is no immunization history on file for this patient.  Diabetes She presents for her follow-up diabetic visit. She has type 2 diabetes mellitus. Onset time: She was diagnosed at approximate age of 25 years. Her disease course has been worsening. There are no hypoglycemic associated symptoms. Pertinent negatives for hypoglycemia include no confusion, headaches, pallor or seizures. Associated symptoms include fatigue and polydipsia. Pertinent negatives for diabetes include no chest pain, no polyphagia and no polyuria. There are no hypoglycemic complications. Symptoms are worsening. Risk factors for coronary artery disease include diabetes mellitus, dyslipidemia, hypertension, obesity, sedentary lifestyle and tobacco exposure. Current diabetic treatment  includes insulin injections and oral agent (monotherapy). She is compliant with treatment most of the time. Her weight is stable. She is following a generally unhealthy diet. When asked about meal planning, she reported none. She has not had a previous visit with a dietitian (She declined referral to a dietitian.). Her breakfast blood glucose range is generally 130-140 mg/dl. Her lunch blood glucose range is generally >200 mg/dl. Her dinner blood glucose range is generally >200 mg/dl. Her  overall blood glucose range is >200 mg/dl. Eye exam is current.  Hyperlipidemia This is a chronic problem. The current episode started more than 1 year ago. Pertinent negatives include no chest pain, myalgias or shortness of breath. Current antihyperlipidemic treatment includes bile acid squestrants. Risk factors for coronary artery disease include diabetes mellitus, dyslipidemia, hypertension, obesity and a sedentary lifestyle.  Hypertension This is a chronic problem. The current episode started more than 1 year ago. The problem is controlled. Pertinent negatives include no chest pain, headaches, palpitations or shortness of breath. Past treatments include angiotensin blockers. Hypertensive end-organ damage includes a thyroid problem.  Thyroid Problem Presents for follow-up visit. Symptoms include fatigue. Patient reports no cold intolerance, diarrhea, heat intolerance or palpitations. The symptoms have been stable. Past treatments include levothyroxine. The following procedures have not been performed: thyroidectomy. Her past medical history is significant for hyperlipidemia.     Review of Systems  Constitutional: Positive for fatigue. Negative for fever, chills and unexpected weight change.  HENT: Negative for trouble swallowing and voice change.   Eyes: Negative for visual disturbance.  Respiratory: Negative for cough, shortness of breath and wheezing.   Cardiovascular: Negative for chest pain, palpitations and leg swelling.  Gastrointestinal: Negative for nausea, vomiting and diarrhea.  Endocrine: Positive for polydipsia. Negative for cold intolerance, heat intolerance, polyphagia and polyuria.  Musculoskeletal: Negative for myalgias and arthralgias.  Skin: Negative for color change, pallor, rash and wound.  Neurological: Negative for seizures and headaches.  Psychiatric/Behavioral: Negative for suicidal ideas and confusion.    Objective:    BP 132/88 mmHg  Pulse 90  Ht '5\' 3"'  (1.6 m)   Wt 188 lb (85.276 kg)  BMI 33.31 kg/m2  SpO2 98%  Wt Readings from Last 3 Encounters:  07/13/15 188 lb (85.276 kg)  06/22/15 188 lb (85.276 kg)  03/02/15 188 lb (85.276 kg)    Physical Exam  Constitutional: She is oriented to person, place, and time. She appears well-developed.  HENT:  Head: Normocephalic and atraumatic.  Eyes: EOM are normal.  Neck: Normal range of motion. Neck supple. No tracheal deviation present. No thyromegaly present.  Cardiovascular: Normal rate and regular rhythm.   Pulmonary/Chest: Effort normal and breath sounds normal.  Abdominal: Soft. Bowel sounds are normal. There is no tenderness. There is no guarding.  Musculoskeletal: Normal range of motion. She exhibits no edema.  Neurological: She is alert and oriented to person, place, and time. She has normal reflexes. No cranial nerve deficit. Coordination normal.  Skin: Skin is warm and dry. No rash noted. No erythema. No pallor.  Psychiatric: She has a normal mood and affect. Judgment normal.     CMP ( most recent) CMP     Component Value Date/Time   NA 130* 06/12/2009 1155   K 4.0 06/12/2009 1155   CL 94* 06/12/2009 1155   CO2 23 06/12/2009 1155   GLUCOSE 499* 06/12/2009 1155   BUN 18 06/12/2009 1155   CREATININE 0.98 06/12/2009 1155   CALCIUM 9.7 06/12/2009 1155   PROT 6.4  01/20/2008 2225   ALBUMIN 2.9* 01/20/2008 2225   AST 15 01/20/2008 2225   ALT 22 01/20/2008 2225   ALKPHOS 66 01/20/2008 2225   BILITOT 0.7 01/20/2008 2225   GFRNONAA 60* 06/12/2009 1155   GFRAA  06/12/2009 1155    >60        The eGFR has been calculated using the MDRD equation. This calculation has not been validated in all clinical situations. eGFR's persistently <60 mL/min signify possible Chronic Kidney Disease.     Diabetic Labs (most recent): Lab Results  Component Value Date   HGBA1C 9.8 06/14/2015   HGBA1C 8 11/07/2014   HGBA1C 8.5 03/24/2014     Lipid Panel ( most recent) Lipid Panel      Component Value Date/Time   CHOL  01/21/2008 0636    174        ATP III CLASSIFICATION:  <200     mg/dL   Desirable  200-239  mg/dL   Borderline High  >=240    mg/dL   High   TRIG 168* 01/21/2008 0636   HDL 28* 01/21/2008 0636   CHOLHDL 6.2 01/21/2008 0636   VLDL 34 01/21/2008 0636   LDLCALC * 01/21/2008 0636    112        Total Cholesterol/HDL:CHD Risk Coronary Heart Disease Risk Table                     Men   Women  1/2 Average Risk   3.4   3.3            Assessment & Plan:   1. Uncontrolled type 2 diabetes mellitus with complication, with long-term current use of insulin ([place])  - patient remains at a high risk for more acute and chronic complications of diabetes which include CAD, CVA, CKD, retinopathy, and neuropathy. These are all discussed in detail with the patient.  Patient came with above target glucose profile, and  recent A1c  Increasing to 9.8% . -Review of her logs show that she has well controlled fasting blood glucose profile but significantly above target postprandial glycemic profile. It appears that she is using expired  Humalog insulin that she received from family members.  Glucose logs and insulin administration records pertaining to this visit,  to be scanned into patient's records.  Recent labs reviewed.   - I have re-counseled the patient on diet management and weight loss  by adopting a carbohydrate restricted / protein rich  Diet.  - Suggestion is made for patient to avoid simple carbohydrates   from their diet including Cakes , Desserts, Ice Cream,  Soda (  diet and regular) , Sweet Tea , Candies,  Chips, Cookies, Artificial Sweeteners,   and "Sugar-free" Products .  This will help patient to have stable blood glucose profile and potentially avoid unintended  Weight gain.  - Patient is advised to stick to a routine mealtimes to eat 3 meals  a day and avoid unnecessary snacks ( to snack only to correct hypoglycemia).  - The patient  has been   scheduled with Jearld Fenton, RDN, CDE for individualized DM education.  - I have approached patient with the following individualized plan to manage diabetes and patient agrees. -Increase Lantus to 52 units qhs. - I suspect she used expired Humalog from him and members, I gave her samples for fresh Humalog pen and advised her to pick up her own prescription and continue  Humalog  18 units TIDAC for premeal BG readings  of 90-143m/dl, plus patient specific sliding scale insulin for correction of unexpected hyperglycemia above 1535mdl, associated with strict monitoring of BG AC and HS.  -Adjustment parameters for hypo and hyperglycemia were given in a written document to patient. -Patient is encouraged to call clinic for blood glucose levels less than 70 or above 300 mg /dl. -I will continue Metformin 1gm PO BID. - She complained about cost of Victoza, gave her samples of Trulicity 0.5.63OVeekly and prescribed for her. This will replace Victoza if it is covered by her insurance.  - Patient specific target  for A1c; LDL, HDL, Triglycerides, and  Waist Circumference were discussed in detail.  2) BP/HTN: Controlled. Continue current medications including ACEI/ARB. 3) Lipids/HPL:  continue Zetia, with cocaine fasting lipid panel on next visit. She has statin intolerance. 4)  Weight/Diet: CDE consult in progress, exercise, and carbohydrates information provided.  5) hypothyroidism: Continue her levothyroxine  100 g by mouth every morning.  - We discussed about correct intake of levothyroxine, at fasting, with water, separated by at least 30 minutes from breakfast, and separated by more than 4 hours from calcium, iron, multivitamins, acid reflux medications (PPIs). -Patient is made aware of the fact that thyroid hormone replacement is needed for life, dose to be adjusted by periodic monitoring of thyroid function tests.  6) Chronic Care/Health Maintenance:  -Patient  is on ARB  medications and  encouraged to continue to follow up with Ophthalmology, Podiatrist at least yearly or according to recommendations, and advised to  stay away from smoking. I have recommended yearly flu vaccine and pneumonia vaccination at least every 5 years; moderate intensity exercise for up to 150 minutes weekly; and  sleep for at least 7 hours a day.  - 25 minutes of time was spent on the care of this patient , 50% of which was applied for counseling on diabetes complications and their preventions.  - I advised patient to maintain close follow up with HORory PercyMD for primary care needs.  Patient is asked to bring meter and  blood glucose logs during their next visit.   Follow up plan: -Return in about 9 weeks (around 09/14/2015) for diabetes, high blood pressure, high cholesterol, underactive thyroid.  GeGlade LloydMD Phone: [phone]: [phone] 07/13/2015, 9:17 AM

## 2015-07-20 ENCOUNTER — Encounter: Payer: Self-pay | Admitting: "Endocrinology

## 2015-07-31 ENCOUNTER — Other Ambulatory Visit: Payer: Self-pay | Admitting: "Endocrinology

## 2015-09-09 ENCOUNTER — Other Ambulatory Visit: Payer: Self-pay | Admitting: "Endocrinology

## 2015-09-13 ENCOUNTER — Encounter: Payer: Self-pay | Admitting: "Endocrinology

## 2015-09-13 ENCOUNTER — Ambulatory Visit (INDEPENDENT_AMBULATORY_CARE_PROVIDER_SITE_OTHER): Payer: 59 | Admitting: "Endocrinology

## 2015-09-13 VITALS — BP 146/81 | HR 78 | Ht 63.0 in | Wt 186.0 lb

## 2015-09-13 DIAGNOSIS — IMO0002 Reserved for concepts with insufficient information to code with codable children: Secondary | ICD-10-CM

## 2015-09-13 DIAGNOSIS — E785 Hyperlipidemia, unspecified: Secondary | ICD-10-CM

## 2015-09-13 DIAGNOSIS — E038 Other specified hypothyroidism: Secondary | ICD-10-CM | POA: Diagnosis not present

## 2015-09-13 DIAGNOSIS — E118 Type 2 diabetes mellitus with unspecified complications: Secondary | ICD-10-CM

## 2015-09-13 DIAGNOSIS — Z794 Long term (current) use of insulin: Secondary | ICD-10-CM

## 2015-09-13 DIAGNOSIS — E1165 Type 2 diabetes mellitus with hyperglycemia: Secondary | ICD-10-CM

## 2015-09-13 DIAGNOSIS — I10 Essential (primary) hypertension: Secondary | ICD-10-CM | POA: Diagnosis not present

## 2015-09-13 MED ORDER — LEVOTHYROXINE SODIUM 100 MCG PO TABS: 100.0000 ug | ORAL_TABLET | Freq: Every day | ORAL | Status: DC

## 2015-09-13 MED ORDER — DULAGLUTIDE 1.5 MG/0.5ML ~~LOC~~ SOAJ: 1.5000 mg | SUBCUTANEOUS | Status: DC

## 2015-09-13 NOTE — Patient Instructions (Signed)

## 2015-09-13 NOTE — Progress Notes (Signed)
Subjective:    Patient ID: [name], female    DOB: 10/16/1955, PCP Rory Percy, MD   Past Medical History  Diagnosis Date  . Bradycardia    Past Surgical History  Procedure Laterality Date  . Tonsillectomy    . Abdominal hysterectomy    . Hemorhoidectomy    . Appendectomy    . Neck surgery    . Refractive surgery     Social History   Social History  . Marital Status: Married    Spouse Name: N/A  . Number of Children: N/A  . Years of Education: N/A   Social History Main Topics  . Smoking status: Former Research scientist (life sciences)  . Smokeless tobacco: None  . Alcohol Use: No  . Drug Use: No  . Sexual Activity: Not Asked   Other Topics Concern  . None   Social History Narrative   Outpatient Encounter Prescriptions as of 09/13/2015  Medication Sig  . acetaminophen (TYLENOL) 500 MG tablet Take 500 mg by mouth every 6 (six) hours as needed.  Marland Kitchen apixaban (ELIQUIS) 5 MG TABS tablet Take 5 mg by mouth 2 (two) times daily.  . Calcium Citrate-Vitamin D (CALCIUM + D PO) Take by mouth.  . docusate sodium (COLACE) 100 MG capsule Take 100 mg by mouth daily.  . Dulaglutide (TRULICITY) 1.5 BS/4.9QP SOPN Inject 1.5 mg into the skin once a week.  . DULoxetine (CYMBALTA) 60 MG capsule Take 60 mg by mouth daily.  Marland Kitchen ezetimibe (ZETIA) 10 MG tablet TAKE ONE TABLET BY MOUTH AT BEDTIME.  . folic acid (FOLVITE) 1 MG tablet Take 1 mg by mouth daily.  . Insulin Glargine (LANTUS [place]) Inject 60 Units into the skin at bedtime.  . insulin lispro (HUMALOG) 100 UNIT/ML injection Inject 20-26 Units into the skin 3 (three) times daily before meals.  Marland Kitchen levothyroxine (SYNTHROID, LEVOTHROID) 100 MCG tablet TAKE ONE TABLET BY MOUTH DAILY BEFORE BREAKFAST.  Marland Kitchen levothyroxine (SYNTHROID, LEVOTHROID) 100 MCG tablet Take 1 tablet (100 mcg total) by mouth daily before breakfast.  . metFORMIN (GLUCOPHAGE) 1000 MG tablet Take 1,200 mg by mouth 2 (two) times daily with a meal.  . methotrexate (RHEUMATREX) 2.5 MG  tablet Take 2.5 mg by mouth once a week. Caution:Chemotherapy. Protect from light. Takes 7 tablets every Monday night only  . olmesartan-hydrochlorothiazide (BENICAR HCT) 40-25 MG per tablet Take 1 tablet by mouth daily. 1/2 tab daily  . pantoprazole (PROTONIX) 40 MG tablet Take 40 mg by mouth 2 (two) times daily.  . pregabalin (LYRICA) 150 MG capsule Take 150 mg by mouth 2 (two) times daily.  . Vitamin D, Ergocalciferol, (DRISDOL) 50000 UNITS CAPS capsule TAKE ONE CAPSULE BY MOUTH EVERY WEEK  . [DISCONTINUED] Dulaglutide (TRULICITY) 5.91 MB/8.4YK SOPN Inject 0.75 mg into the skin once a week.  . [DISCONTINUED] predniSONE (DELTASONE) 5 MG tablet Take 5 mg by mouth daily with breakfast.   No facility-administered encounter medications on file as of 09/13/2015.   ALLERGIES: No Known Allergies VACCINATION STATUS:  There is no immunization history on file for this patient.  Diabetes She presents for her follow-up diabetic visit. She has type 2 diabetes mellitus. Onset time: She was diagnosed at approximate age of 71 years. Her disease course has been worsening. There are no hypoglycemic associated symptoms. Pertinent negatives for hypoglycemia include no confusion, headaches, pallor or seizures. Associated symptoms include fatigue and polydipsia. Pertinent negatives for diabetes include no chest pain, no polyphagia and no polyuria. There are no hypoglycemic complications.  Symptoms are worsening. Risk factors for coronary artery disease include diabetes mellitus, dyslipidemia, hypertension, obesity, sedentary lifestyle and tobacco exposure. Current diabetic treatment includes insulin injections and oral agent (monotherapy). She is compliant with treatment some of the time. Her weight is stable. She is following a generally unhealthy diet. When asked about meal planning, she reported none. She has not had a previous visit with a dietitian (She declined referral to a dietitian.). Her breakfast blood  glucose range is generally >200 mg/dl. Her lunch blood glucose range is generally >200 mg/dl. Her dinner blood glucose range is generally >200 mg/dl. Her overall blood glucose range is >200 mg/dl. Eye exam is current.  Hyperlipidemia This is a chronic problem. The current episode started more than 1 year ago. Pertinent negatives include no chest pain, myalgias or shortness of breath. Current antihyperlipidemic treatment includes bile acid squestrants. Risk factors for coronary artery disease include diabetes mellitus, dyslipidemia, hypertension, obesity and a sedentary lifestyle.  Hypertension This is a chronic problem. The current episode started more than 1 year ago. The problem is controlled. Pertinent negatives include no chest pain, headaches, palpitations or shortness of breath. Past treatments include angiotensin blockers. Hypertensive end-organ damage includes a thyroid problem.  Thyroid Problem Presents for follow-up visit. Symptoms include fatigue. Patient reports no cold intolerance, diarrhea, heat intolerance or palpitations. The symptoms have been stable. Past treatments include levothyroxine. The following procedures have not been performed: thyroidectomy. Her past medical history is significant for hyperlipidemia.     Review of Systems  Constitutional: Positive for fatigue. Negative for fever, chills and unexpected weight change.  HENT: Negative for trouble swallowing and voice change.   Eyes: Negative for visual disturbance.  Respiratory: Negative for cough, shortness of breath and wheezing.   Cardiovascular: Negative for chest pain, palpitations and leg swelling.  Gastrointestinal: Negative for nausea, vomiting and diarrhea.  Endocrine: Positive for polydipsia. Negative for cold intolerance, heat intolerance, polyphagia and polyuria.  Musculoskeletal: Negative for myalgias and arthralgias.  Skin: Negative for color change, pallor, rash and wound.  Neurological: Negative for  seizures and headaches.  Psychiatric/Behavioral: Negative for suicidal ideas and confusion.    Objective:    BP 146/81 mmHg  Pulse 78  Ht '5\' 3"'  (1.6 m)  Wt 186 lb (84.369 kg)  BMI 32.96 kg/m2  Wt Readings from Last 3 Encounters:  09/13/15 186 lb (84.369 kg)  07/13/15 188 lb (85.276 kg)  06/22/15 188 lb (85.276 kg)    Physical Exam  Constitutional: She is oriented to person, place, and time. She appears well-developed.  HENT:  Head: Normocephalic and atraumatic.  Eyes: EOM are normal.  Neck: Normal range of motion. Neck supple. No tracheal deviation present. No thyromegaly present.  Cardiovascular: Normal rate and regular rhythm.   Pulmonary/Chest: Effort normal and breath sounds normal.  Abdominal: Soft. Bowel sounds are normal. There is no tenderness. There is no guarding.  Musculoskeletal: Normal range of motion. She exhibits no edema.  Neurological: She is alert and oriented to person, place, and time. She has normal reflexes. No cranial nerve deficit. Coordination normal.  Skin: Skin is warm and dry. No rash noted. No erythema. No pallor.  Psychiatric: She has a normal mood and affect. Judgment normal.     CMP ( most recent) CMP     Component Value Date/Time   NA 130* 06/12/2009 1155   K 4.0 06/12/2009 1155   CL 94* 06/12/2009 1155   CO2 23 06/12/2009 1155   GLUCOSE 499* 06/12/2009 1155  BUN 18 06/12/2009 1155   CREATININE 0.98 06/12/2009 1155   CALCIUM 9.7 06/12/2009 1155   PROT 6.4 01/20/2008 2225   ALBUMIN 2.9* 01/20/2008 2225   AST 15 01/20/2008 2225   ALT 22 01/20/2008 2225   ALKPHOS 66 01/20/2008 2225   BILITOT 0.7 01/20/2008 2225   GFRNONAA 60* 06/12/2009 1155   GFRAA  06/12/2009 1155    >60        The eGFR has been calculated using the MDRD equation. This calculation has not been validated in all clinical situations. eGFR's persistently <60 mL/min signify possible Chronic Kidney Disease.     Diabetic Labs (most recent): Lab Results   Component Value Date   HGBA1C 9.8 06/14/2015   HGBA1C 8 11/07/2014   HGBA1C 8.5 03/24/2014     Lipid Panel ( most recent) Lipid Panel     Component Value Date/Time   CHOL  01/21/2008 0636    174        ATP III CLASSIFICATION:  <200     mg/dL   Desirable  200-239  mg/dL   Borderline High  >=240    mg/dL   High   TRIG 168* 01/21/2008 0636   HDL 28* 01/21/2008 0636   CHOLHDL 6.2 01/21/2008 0636   VLDL 34 01/21/2008 0636   LDLCALC * 01/21/2008 0636    112        Total Cholesterol/HDL:CHD Risk Coronary Heart Disease Risk Table                     Men   Women  1/2 Average Risk   3.4   3.3      Assessment & Plan:   1. Uncontrolled type 2 diabetes mellitus with complication, with long-term current use of insulin ([place])  - patient remains at a high risk for more acute and chronic complications of diabetes which include CAD, CVA, CKD, retinopathy, and neuropathy. These are all discussed in detail with the patient.  Patient came with  Persistently above target glucose profile, mainly because she forgets insulin injections during several meals. Her  recent A1c  Increasing to 9.8% .    Glucose logs and insulin administration records pertaining to this visit,  to be scanned into patient's records.  Recent labs reviewed.   - I have re-counseled the patient on diet management and weight loss  by adopting a carbohydrate restricted / protein rich  Diet.  - Suggestion is made for patient to avoid simple carbohydrates   from their diet including Cakes , Desserts, Ice Cream,  Soda (  diet and regular) , Sweet Tea , Candies,  Chips, Cookies, Artificial Sweeteners,   and "Sugar-free" Products .  This will help patient to have stable blood glucose profile and potentially avoid unintended  Weight gain.  - Patient is advised to stick to a routine mealtimes to eat 3 meals  a day and avoid unnecessary snacks ( to snack only to correct hypoglycemia).  - The patient  has been  scheduled with Jearld Fenton, RDN, CDE for individualized DM education.  - I have approached patient with the following individualized plan to manage diabetes and patient agrees. -Increase Lantus to 60 units qhs. -  Increase   Humalog  To 20 units TIDAC for premeal BG readings of 90-130m/dl, plus patient specific sliding scale insulin for correction of unexpected hyperglycemia above 1533mdl, associated with strict monitoring of BG AC and HS.  -Adjustment parameters for hypo and hyperglycemia were given in a  written document to patient. -Patient is encouraged to call clinic for blood glucose levels less than 70 or above 300 mg /dl. -I will continue Metformin 1gm PO BID. - She tolerated  Trulicity, I would increase to 1.5 mg weekly and prescribed for her.   - Patient specific target  for A1c; LDL, HDL, Triglycerides, and  Waist Circumference were discussed in detail.  2) BP/HTN: Controlled. Continue current medications including ACEI/ARB. 3) Lipids/HPL:  continue Zetia, with cocaine fasting lipid panel on next visit. She has statin intolerance. 4)  Weight/Diet: CDE consult in progress, exercise, and carbohydrates information provided.  5) hypothyroidism: Continue her levothyroxine  100 g by mouth every morning.  - We discussed about correct intake of levothyroxine, at fasting, with water, separated by at least 30 minutes from breakfast, and separated by more than 4 hours from calcium, iron, multivitamins, acid reflux medications (PPIs). -Patient is made aware of the fact that thyroid hormone replacement is needed for life, dose to be adjusted by periodic monitoring of thyroid function tests.  6) Chronic Care/Health Maintenance:  -Patient  is on ARB  medications and encouraged to continue to follow up with Ophthalmology, Podiatrist at least yearly or according to recommendations, and advised to  stay away from smoking. I have recommended yearly flu vaccine and pneumonia vaccination at least every 5 years; moderate  intensity exercise for up to 150 minutes weekly; and  sleep for at least 7 hours a day.  - 25 minutes of time was spent on the care of this patient , 50% of which was applied for counseling on diabetes complications and their preventions.  - I advised patient to maintain close follow up with Rory Percy, MD for primary care needs.  Patient is asked to bring meter and  blood glucose logs during their next visit.   Follow up plan: -Return in about 4 weeks (around 10/11/2015), or or PRN.  Glade Lloyd, MD Phone: [phone]  Fax: [phone]   09/13/2015, 1:41 PM

## 2015-09-25 ENCOUNTER — Other Ambulatory Visit: Payer: Self-pay | Admitting: "Endocrinology

## 2015-09-25 ENCOUNTER — Encounter: Payer: Self-pay | Admitting: "Endocrinology

## 2015-10-05 ENCOUNTER — Other Ambulatory Visit: Payer: Self-pay | Admitting: "Endocrinology

## 2015-10-31 ENCOUNTER — Other Ambulatory Visit: Payer: Self-pay | Admitting: "Endocrinology

## 2015-11-22 ENCOUNTER — Other Ambulatory Visit: Payer: Self-pay | Admitting: "Endocrinology

## 2016-02-07 ENCOUNTER — Other Ambulatory Visit: Payer: Self-pay | Admitting: "Endocrinology

## 2016-07-16 DIAGNOSIS — E782 Mixed hyperlipidemia: Secondary | ICD-10-CM | POA: Diagnosis not present

## 2016-07-16 DIAGNOSIS — M0579 Rheumatoid arthritis with rheumatoid factor of multiple sites without organ or systems involvement: Secondary | ICD-10-CM | POA: Diagnosis not present

## 2016-07-16 DIAGNOSIS — E1142 Type 2 diabetes mellitus with diabetic polyneuropathy: Secondary | ICD-10-CM | POA: Diagnosis not present

## 2016-07-16 DIAGNOSIS — Z86711 Personal history of pulmonary embolism: Secondary | ICD-10-CM | POA: Diagnosis not present

## 2016-07-16 DIAGNOSIS — E1165 Type 2 diabetes mellitus with hyperglycemia: Secondary | ICD-10-CM | POA: Diagnosis not present

## 2016-07-18 DIAGNOSIS — I10 Essential (primary) hypertension: Secondary | ICD-10-CM | POA: Diagnosis not present

## 2016-07-18 DIAGNOSIS — Z0001 Encounter for general adult medical examination with abnormal findings: Secondary | ICD-10-CM | POA: Diagnosis not present

## 2016-07-18 DIAGNOSIS — E1142 Type 2 diabetes mellitus with diabetic polyneuropathy: Secondary | ICD-10-CM | POA: Diagnosis not present

## 2016-07-18 DIAGNOSIS — Z6833 Body mass index (BMI) 33.0-33.9, adult: Secondary | ICD-10-CM | POA: Diagnosis not present

## 2016-07-18 DIAGNOSIS — M0579 Rheumatoid arthritis with rheumatoid factor of multiple sites without organ or systems involvement: Secondary | ICD-10-CM | POA: Diagnosis not present

## 2016-07-18 DIAGNOSIS — I2782 Chronic pulmonary embolism: Secondary | ICD-10-CM | POA: Diagnosis not present

## 2016-07-18 DIAGNOSIS — M797 Fibromyalgia: Secondary | ICD-10-CM | POA: Diagnosis not present

## 2016-07-18 DIAGNOSIS — E782 Mixed hyperlipidemia: Secondary | ICD-10-CM | POA: Diagnosis not present

## 2016-08-15 DIAGNOSIS — J0101 Acute recurrent maxillary sinusitis: Secondary | ICD-10-CM | POA: Diagnosis not present

## 2016-08-15 DIAGNOSIS — E039 Hypothyroidism, unspecified: Secondary | ICD-10-CM | POA: Diagnosis not present

## 2016-08-15 DIAGNOSIS — Z6832 Body mass index (BMI) 32.0-32.9, adult: Secondary | ICD-10-CM | POA: Diagnosis not present

## 2016-08-15 DIAGNOSIS — R42 Dizziness and giddiness: Secondary | ICD-10-CM | POA: Diagnosis not present

## 2016-08-15 DIAGNOSIS — R300 Dysuria: Secondary | ICD-10-CM | POA: Diagnosis not present

## 2016-08-15 DIAGNOSIS — E1165 Type 2 diabetes mellitus with hyperglycemia: Secondary | ICD-10-CM | POA: Diagnosis not present

## 2016-08-16 ENCOUNTER — Other Ambulatory Visit: Payer: Self-pay | Admitting: "Endocrinology

## 2016-08-27 DIAGNOSIS — J209 Acute bronchitis, unspecified: Secondary | ICD-10-CM | POA: Diagnosis not present

## 2016-08-27 DIAGNOSIS — Z6833 Body mass index (BMI) 33.0-33.9, adult: Secondary | ICD-10-CM | POA: Diagnosis not present

## 2016-08-27 DIAGNOSIS — J019 Acute sinusitis, unspecified: Secondary | ICD-10-CM | POA: Diagnosis not present

## 2016-09-04 DIAGNOSIS — J019 Acute sinusitis, unspecified: Secondary | ICD-10-CM | POA: Diagnosis not present

## 2016-09-04 DIAGNOSIS — J209 Acute bronchitis, unspecified: Secondary | ICD-10-CM | POA: Diagnosis not present

## 2016-09-04 DIAGNOSIS — Z6833 Body mass index (BMI) 33.0-33.9, adult: Secondary | ICD-10-CM | POA: Diagnosis not present

## 2016-09-04 DIAGNOSIS — R0781 Pleurodynia: Secondary | ICD-10-CM | POA: Diagnosis not present

## 2016-09-09 ENCOUNTER — Other Ambulatory Visit: Payer: Self-pay

## 2016-09-09 DIAGNOSIS — Z86718 Personal history of other venous thrombosis and embolism: Secondary | ICD-10-CM | POA: Diagnosis not present

## 2016-09-09 DIAGNOSIS — I2699 Other pulmonary embolism without acute cor pulmonale: Secondary | ICD-10-CM | POA: Diagnosis not present

## 2016-09-09 DIAGNOSIS — Z79899 Other long term (current) drug therapy: Secondary | ICD-10-CM | POA: Diagnosis not present

## 2016-09-09 DIAGNOSIS — K760 Fatty (change of) liver, not elsewhere classified: Secondary | ICD-10-CM | POA: Diagnosis not present

## 2016-09-09 DIAGNOSIS — I2609 Other pulmonary embolism with acute cor pulmonale: Secondary | ICD-10-CM | POA: Diagnosis not present

## 2016-09-09 DIAGNOSIS — R1010 Upper abdominal pain, unspecified: Secondary | ICD-10-CM | POA: Diagnosis not present

## 2016-09-09 DIAGNOSIS — K219 Gastro-esophageal reflux disease without esophagitis: Secondary | ICD-10-CM | POA: Diagnosis not present

## 2016-09-09 DIAGNOSIS — Z794 Long term (current) use of insulin: Secondary | ICD-10-CM | POA: Diagnosis not present

## 2016-09-09 DIAGNOSIS — M5414 Radiculopathy, thoracic region: Secondary | ICD-10-CM | POA: Diagnosis not present

## 2016-09-09 DIAGNOSIS — Z6832 Body mass index (BMI) 32.0-32.9, adult: Secondary | ICD-10-CM | POA: Diagnosis not present

## 2016-09-09 DIAGNOSIS — M797 Fibromyalgia: Secondary | ICD-10-CM | POA: Diagnosis not present

## 2016-09-09 DIAGNOSIS — M069 Rheumatoid arthritis, unspecified: Secondary | ICD-10-CM | POA: Diagnosis not present

## 2016-09-09 DIAGNOSIS — E279 Disorder of adrenal gland, unspecified: Secondary | ICD-10-CM | POA: Diagnosis not present

## 2016-09-09 DIAGNOSIS — E1140 Type 2 diabetes mellitus with diabetic neuropathy, unspecified: Secondary | ICD-10-CM | POA: Diagnosis not present

## 2016-09-09 DIAGNOSIS — Z7901 Long term (current) use of anticoagulants: Secondary | ICD-10-CM | POA: Diagnosis not present

## 2016-09-12 ENCOUNTER — Ambulatory Visit (HOSPITAL_COMMUNITY)
Admission: RE | Admit: 2016-09-12 | Discharge: 2016-09-12 | Disposition: A | Discharge status: Home / Self Care | Payer: Medicare Other | Source: Ambulatory Visit | Attending: Vascular Surgery | Admitting: Vascular Surgery

## 2016-09-12 ENCOUNTER — Encounter (HOSPITAL_COMMUNITY)
Admission: RE | Disposition: A | Discharge status: Home / Self Care | Payer: Self-pay | Source: Ambulatory Visit | Attending: Vascular Surgery

## 2016-09-12 ENCOUNTER — Encounter (HOSPITAL_COMMUNITY): Payer: Self-pay | Admitting: *Deleted

## 2016-09-12 DIAGNOSIS — Z7901 Long term (current) use of anticoagulants: Secondary | ICD-10-CM | POA: Insufficient documentation

## 2016-09-12 DIAGNOSIS — Z87891 Personal history of nicotine dependence: Secondary | ICD-10-CM | POA: Insufficient documentation

## 2016-09-12 DIAGNOSIS — I2699 Other pulmonary embolism without acute cor pulmonale: Secondary | ICD-10-CM | POA: Diagnosis not present

## 2016-09-12 DIAGNOSIS — I2782 Chronic pulmonary embolism: Secondary | ICD-10-CM | POA: Diagnosis not present

## 2016-09-12 HISTORY — PX: IVC FILTER INSERTION: CATH118245

## 2016-09-12 LAB — POCT I-STAT, CHEM 8
BUN: 8 mg/dL (ref 6–20)
Calcium, Ion: 1.12 mmol/L — ABNORMAL LOW (ref 1.15–1.40)
Chloride: 101 mmol/L (ref 101–111)
Creatinine, Ser: 0.8 mg/dL (ref 0.44–1.00)
Glucose, Bld: 259 mg/dL — ABNORMAL HIGH (ref 65–99)
HCT: 30 % — ABNORMAL LOW (ref 36.0–46.0)
Hemoglobin: 10.2 g/dL — ABNORMAL LOW (ref 12.0–15.0)
Potassium: 4.1 mmol/L (ref 3.5–5.1)
Sodium: 137 mmol/L (ref 135–145)
TCO2: 25 mmol/L (ref 0–100)

## 2016-09-12 SURGERY — IVC FILTER INSERTION
Anesthesia: LOCAL

## 2016-09-12 MED ORDER — LIDOCAINE HCL (PF) 1 % IJ SOLN
INTRAMUSCULAR | Status: DC | PRN
Start: 2016-09-12 — End: 2016-09-12
  Administered 2016-09-12: 10 mL via INTRADERMAL

## 2016-09-12 MED ORDER — HEPARIN (PORCINE) IN NACL 2-0.9 UNIT/ML-% IJ SOLN
INTRAMUSCULAR | Status: AC
  Filled 2016-09-12: qty 500

## 2016-09-12 MED ORDER — LIDOCAINE HCL (PF) 1 % IJ SOLN
INTRAMUSCULAR | Status: AC
  Filled 2016-09-12: qty 30

## 2016-09-12 MED ORDER — ACETAMINOPHEN 325 MG PO TABS: 650.0000 mg | ORAL_TABLET | ORAL | Status: DC | PRN

## 2016-09-12 MED ORDER — MIDAZOLAM HCL 2 MG/2ML IJ SOLN
INTRAMUSCULAR | Status: AC
  Filled 2016-09-12: qty 2

## 2016-09-12 MED ORDER — FENTANYL CITRATE (PF) 100 MCG/2ML IJ SOLN
INTRAMUSCULAR | Status: DC | PRN
  Administered 2016-09-12: 50 ug via INTRAVENOUS

## 2016-09-12 MED ORDER — SODIUM CHLORIDE 0.9 % IV SOLN: 1.0000 mL/kg/h | INTRAVENOUS | Status: DC

## 2016-09-12 MED ORDER — FENTANYL CITRATE (PF) 100 MCG/2ML IJ SOLN
INTRAMUSCULAR | Status: AC
  Filled 2016-09-12: qty 2

## 2016-09-12 MED ORDER — IODIXANOL 320 MG/ML IV SOLN
INTRAVENOUS | Status: DC | PRN
Start: 2016-09-12 — End: 2016-09-12
  Administered 2016-09-12: 35 mL via INTRAVENOUS

## 2016-09-12 MED ORDER — HEPARIN (PORCINE) IN NACL 2-0.9 UNIT/ML-% IJ SOLN
INTRAMUSCULAR | Status: AC | PRN
  Administered 2016-09-12: 500 mL

## 2016-09-12 MED ORDER — SODIUM CHLORIDE 0.9 % IV SOLN
INTRAVENOUS | Status: DC
  Administered 2016-09-12: 06:00:00 via INTRAVENOUS

## 2016-09-12 MED ORDER — MIDAZOLAM HCL 2 MG/2ML IJ SOLN
INTRAMUSCULAR | Status: DC | PRN
  Administered 2016-09-12: 1 mg via INTRAVENOUS

## 2016-09-12 SURGICAL SUPPLY — 9 items
COVER PRB 48X5XTLSCP FOLD TPE (BAG) ×1 IMPLANT
COVER PROBE 5X48 (BAG) ×1
FILTER VC CELECT-FEMORAL (Filter) ×2 IMPLANT
SHEATH PINNACLE 5F 10CM (SHEATH) ×2 IMPLANT
STOPCOCK MORSE 400PSI 3WAY (MISCELLANEOUS) ×2 IMPLANT
SYR MEDRAD MARK V 150ML (SYRINGE) ×2 IMPLANT
TRAY PV CATH (CUSTOM PROCEDURE TRAY) ×2 IMPLANT
TUBING HIGH PRESSURE 120CM (CONNECTOR) ×2 IMPLANT
WIRE BENTSON .035X145CM (WIRE) ×2 IMPLANT

## 2016-09-12 NOTE — Op Note (Addendum)
    OPERATIVE NOTE   PROCEDURE: 1. 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 Evidence of some residual thrombus in right iliac venous system 2. Patent IVC with clear right renal vein anatomy 3. Successful placement of Celect at L2 (infrarenal position)  SPECIMEN(S):  none  INDICATIONS:   [name] is a 61 y.o. female who presents with recurrent pulmonary embolism while on Eliquis.  Inferior vena cava filter placement was requested by her primary care physician and Dr. Donnetta Hutching agreed with indication.  I discussed with the patient risk, benefits, and alternatives to inferior vena caval filter placement.  The risks include but are not limited to: bleeding, infection, possible injury of the inferior vena cava, injury of structures adjacent to the filter including bowel, migration, fracture, continued pulmonary embolization, and caval thrombosis.  The patient acknowledges the risks and agrees to proceed.  DESCRIPTION: After obtain full informed written consent, the patient was brought back to the angiography suite and placed supine upon the angiogram table.  The patient was connected to cardiopulmonary monitoring.  The patient was monitored by one radiologic technician and a nursing through this case.  The patient was given conscious sedation as documented in the chart.    After obtaining adequate anesthesia, the patient was prepped and draped in the standard fashion for: inferior vena cava filter placement via a femoral approach.   The right femoral vein vein was cannulated with an 18-gauge needle after giving local anesthesia to  the cannulation site.  A Benson wire was passed into the inferior vena cava.  The skin and subcutaneous tract was dilated with a 10-Fr dilator.  The [facility] IVC placement sheath and docked dilator was advanced as a unit over the wire into the inferior vena cava, landing the marked dilator at the level of L2.  The dilator was connected to the power injector circuit.  After performing a declotting and deairring maneuver, an inferior vena cavagram was completed to identify the renal vein.  The lowest renal vein was at right.   The dilator and sheath were reposition to be in an infrarenal position.  The dilator was removed and the femoral-oriented Celect filter was loaded through the sheath.  The position of the filter was examined under fluoroscopy before docking the filter delivery device onto the sheath.  The safety button was depressed and then the release button was pushed to detach the filter from the delivery device.  Under fluoroscopy, I verified the filter was detached and slowly removed the delivery device.  The sheath was removed and pressure held to the cannulation site for 5 minutes.  A sterile bandage was applied to the cannulation site.  A single shot fluoroscopic image was completed to verify position of the inferior vena cava filter.  The long term plan for this inferior vena cava filter is: permanent placement.  The patient is aware long-term anticoagulation is still recommended.   COMPLICATIONS: none  CONDITION: stable   Adele Barthel, MD [facility] Office: [phone] Pager: [phone]  09/12/2016, 8:14 AM

## 2016-09-12 NOTE — Discharge Instructions (Signed)
Angiogram, Care After °This sheet gives you information about how to care for yourself after your procedure. Your health care provider may also give you more specific instructions. If you have problems or questions, contact your health care provider. °What can I expect after the procedure? °After the procedure, it is common to have bruising and tenderness at the catheter insertion area. °Follow these instructions at home: °Insertion site care  °· Follow instructions from your health care provider about how to take care of your insertion site. Make sure you: °¨ Wash your hands with soap and water before you change your bandage (dressing). If soap and water are not available, use hand sanitizer. °¨ Change your dressing as told by your health care provider. °¨ Leave stitches (sutures), skin glue, or adhesive strips in place. These skin closures may need to stay in place for 2 weeks or longer. If adhesive strip edges start to loosen and curl up, you may trim the loose edges. Do not remove adhesive strips completely unless your health care provider tells you to do that. °· Do not take baths, swim, or use a hot tub until your health care provider approves. °· You may shower 24-48 hours after the procedure or as told by your health care provider. °¨ Gently wash the site with plain soap and water. °¨ Pat the area dry with a clean towel. °¨ Do not rub the site. This may cause bleeding. °· Do not apply powder or lotion to the site. Keep the site clean and dry. °· Check your insertion site every day for signs of infection. Check for: °¨ Redness, swelling, or pain. °¨ Fluid or blood. °¨ Warmth. °¨ Pus or a bad smell. °Activity  °· Rest as told by your health care provider, usually for 1-2 days. °· Do not lift anything that is heavier than 10 lbs. (4.5 kg) or as told by your health care provider. °· Do not drive for 24 hours if you were given a medicine to help you relax (sedative). °· Do not drive or use heavy machinery while  taking prescription pain medicine. °General instructions  °· Return to your normal activities as told by your health care provider, usually in about a week. Ask your health care provider what activities are safe for you. °· If the catheter site starts bleeding, lie flat and put pressure on the site. If the bleeding does not stop, get help right away. This is a medical emergency. °· Drink enough fluid to keep your urine clear or pale yellow. This helps flush the contrast dye from your body. °· Take over-the-counter and prescription medicines only as told by your health care provider. °· Keep all follow-up visits as told by your health care provider. This is important. °Contact a health care provider if: °· You have a fever or chills. °· You have redness, swelling, or pain around your insertion site. °· You have fluid or blood coming from your insertion site. °· The insertion site feels warm to the touch. °· You have pus or a bad smell coming from your insertion site. °· You have bruising around the insertion site. °· You notice blood collecting in the tissue around the catheter site (hematoma). The hematoma may be painful to the touch. °Get help right away if: °· You have severe pain at the catheter insertion area. °· The catheter insertion area swells very fast. °· The catheter insertion area is bleeding, and the bleeding does not stop when you hold steady pressure on   the area. °· The area near or just beyond the catheter insertion site becomes pale, cool, tingly, or numb. °These symptoms may represent a serious problem that is an emergency. Do not wait to see if the symptoms will go away. Get medical help right away. Call your local emergency services (911 in the [place] Do not drive yourself to the hospital. °Summary °· After the procedure, it is common to have bruising and tenderness at the catheter insertion area. °· After the procedure, it is important to rest and drink plenty of fluids. °· Do not take baths,  swim, or use a hot tub until your health care provider says it is okay to do so. You may shower 24-48 hours after the procedure or as told by your health care provider. °· If the catheter site starts bleeding, lie flat and put pressure on the site. If the bleeding does not stop, get help right away. This is a medical emergency. °This information is not intended to replace advice given to you by your health care provider. Make sure you discuss any questions you have with your health care provider. °Document Released: 08/30/2004 Document Revised: 01/17/2016 Document Reviewed: 01/17/2016 °Elsevier Interactive Patient Education © 2017 [employer] ° °

## 2016-09-12 NOTE — Progress Notes (Signed)
Inpatient Diabetes Program Recommendations  AACE/ADA: New Consensus Statement on Inpatient Glycemic Control (2015)  Target Ranges:  Prepandial:   less than 140 mg/dL      Peak postprandial:   less than 180 mg/dL (1-2 hours)      Critically ill patients:  140 - 180 mg/dL   Lab Results  Component Value Date   GLUCAP 204 (H) 06/14/2009   HGBA1C 9.8 06/14/2015    Review of Glycemic Control Results for [name], [name] (MRN [id number]) as of 09/12/2016 09:12  Ref. Range 09/12/2016 06:18  Glucose Latest Ref Range: 65 - 99 mg/dL 259 (H)   Diabetes history: DM2 Outpatient Diabetes medications: Lantus 26 units bid + Humalog 20-26 units tid + Metformin 1 gm bid Current orders for Inpatient glycemic control: None   Inpatient Diabetes Program Recommendations:  Noted last documented note for endocrinologist's Dr. Liliane Channel office 09/13/15 listed most recent A1c as 9.8. Please consider: - A1c to determine prehospital glycemic control -Glycemic control order set Noted patient currently in cath lab. Will follow.  Thank you, Nani Gasser. Marcie Shearon, RN, MSN, CDE  Diabetes Coordinator Inpatient Glycemic Control Team Team Pager [phone] (8am-5pm) 09/12/2016 9:26 AM

## 2016-09-12 NOTE — H&P (Signed)
Requested by:  Rory Percy, MD  Reason for consultation: recurrent PE    History of Present Illness   [name] is a 61 y.o. (Dec 01, 1955) female who presents with cc: recurrent PE.  This patient was referred to my partner Dr. 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  Last dose of Eliquis was on Monday.  The patient notes some diaphragmatic pain in LUQ with deep inspiration.  The patient denies any leg swelling and is uncertain whether she has a DVT in either leg.  Past Medical History:  Diagnosis Date  . Bradycardia     Past Surgical History:  Procedure Laterality Date  . ABDOMINAL HYSTERECTOMY    . APPENDECTOMY    . hemorhoidectomy    . NECK SURGERY    . REFRACTIVE SURGERY    . TONSILLECTOMY       Social History   Social History  . Marital status: Married    Spouse name: N/A  . Number of children: N/A  . Years of education: N/A   Occupational History  . Not on file.   Social History Main Topics  . Smoking status: Former Research scientist (life sciences)  . Smokeless tobacco: Not on file  . Alcohol use No  . Drug use: No  . Sexual activity: Not on file   Other Topics Concern  . Not on file   Social History Narrative  . No narrative on file    Family History  Problem Relation Age of Onset  . Hypertension Mother   . Cancer Mother   . Diabetes Father     Current Facility-Administered Medications  Medication Dose Route Frequency Provider Last Rate Last Dose  . 0.9 %  sodium chloride infusion   Intravenous Continuous Conrad [place], MD 100 mL/hr at 09/12/16 6283      No Known Allergies  REVIEW OF SYSTEMS (negative unless checked):   Cardiac:  []  Chest pain or chest pressure? [x]  Shortness of breath upon activity? []  Shortness of breath when lying flat? []  Irregular heart rhythm?  Vascular:  []  Pain in calf, thigh, or hip brought on by walking? []  Pain in feet at night that wakes you up from your sleep? [x]  Blood  clot in your veins? []  Leg swelling?  Pulmonary:  []  Oxygen at home? []  Productive cough? []  Wheezing?  Neurologic:  []  Sudden weakness in arms or legs? []  Sudden numbness in arms or legs? []  Sudden onset of difficult speaking or slurred speech? []  Temporary loss of vision in one eye? []  Problems with dizziness?  Gastrointestinal:  []  Blood in stool? []  Vomited blood?  Genitourinary:  []  Burning when urinating? []  Blood in urine?  Psychiatric:  []  Major depression  Hematologic:  []  Bleeding problems? []  Problems with blood clotting?  Dermatologic:  []  Rashes or ulcers?  Constitutional:  []  Fever or chills?  Ear/Nose/Throat:  []  Change in hearing? []  Nose bleeds? []  Sore throat?  Musculoskeletal:  []  Back pain? []  Joint pain? []  Muscle pain?   Physical Examination     Vitals:   09/12/16 0544  BP: (!) 182/84  Pulse: 91  Resp: 18  Temp: 98.9 F (37.2 C)  TempSrc: Oral  SpO2: 97%  Weight: 186 lb (84.4 kg)  Height: 5\' 3"  (1.6 m)   Body mass index is 32.95 kg/m.  General Alert, O x 3, WD, NAD  Head [place]/AT,    Ear/Nose/ Throat Hearing grossly intact, nares without erythema or  drainage, oropharynx without Erythema or Exudate, Mallampati score: 3,   Eyes PERRLA, EOMI,    Neck Supple, mid-line trachea,    Pulmonary Sym exp, good B air movt, CTA B  Cardiac RRR, Nl S1, S2, no Murmurs, No rubs, No S3,S4  Vascular Vessel Right Left  Radial Palpable Palpable  Brachial Palpable Palpable  Carotid Palpable, No Bruit Palpable, No Bruit  Aorta Not palpable N/A  Femoral Palpable Palpable  Popliteal Not palpable Not palpable  PT Not palpable Not palpable  DP Faintly palpable Faintly palpable    Gastro- intestinal soft, non-distended, non-tender to palpation, No guarding or rebound, no HSM, no masses, no CVAT B, No palpable prominent aortic pulse,    Musculo- skeletal M/S 5/5 throughout  , Extremities without ischemic changes  , No edema present, No  visible varicosities , No Lipodermatosclerosis present  Neurologic Cranial nerves 2-12 intact , Pain and light touch intact in extremities , Motor exam as listed above  Psychiatric Judgement intact, Mood & affect appropriate for pt's clinical situation  Dermatologic See M/S exam for extremity exam, No rashes otherwise noted  Lymphatic  Palpable lymph nodes: None    Medical Decision Making   [name] is a 61 y.o. female who presents with: recurrent PE   Based on the patient's vascular studies and examination, Dr Donnetta Hutching offered the patient: permanent IVC filter placement.  I reiterated to the patient that in her situation this IVC filter placement is meant to be permanent, though the Select IVC filter used in this case can be left permanently. I discussed with the patient risk, benefits, and alternatives to inferior vena caval filter placement.   The risks include but are not limited to: bleeding, infection, possible injury of the inferior vena cava, injury of structures adjacent to the filter including bowel, migration and fracture of filter limbs, continued pulmonary embolization, and caval thrombosis.  The patient acknowledges the risks and agrees to proceed.  Thank you for allowing [place] to participate in this patient's care.   Adele Barthel, MD, FACS [facility] Office: [phone] Pager: [phone]  09/12/2016, 7:20 AM

## 2016-09-17 DIAGNOSIS — E039 Hypothyroidism, unspecified: Secondary | ICD-10-CM | POA: Diagnosis not present

## 2016-09-17 DIAGNOSIS — K219 Gastro-esophageal reflux disease without esophagitis: Secondary | ICD-10-CM | POA: Diagnosis not present

## 2016-09-17 DIAGNOSIS — G629 Polyneuropathy, unspecified: Secondary | ICD-10-CM | POA: Diagnosis not present

## 2016-09-17 DIAGNOSIS — E119 Type 2 diabetes mellitus without complications: Secondary | ICD-10-CM | POA: Diagnosis not present

## 2016-09-17 DIAGNOSIS — Z7902 Long term (current) use of antithrombotics/antiplatelets: Secondary | ICD-10-CM | POA: Diagnosis not present

## 2016-09-17 DIAGNOSIS — Z794 Long term (current) use of insulin: Secondary | ICD-10-CM | POA: Diagnosis not present

## 2016-09-17 DIAGNOSIS — Z79899 Other long term (current) drug therapy: Secondary | ICD-10-CM | POA: Diagnosis not present

## 2016-09-17 DIAGNOSIS — Z86718 Personal history of other venous thrombosis and embolism: Secondary | ICD-10-CM | POA: Diagnosis not present

## 2016-09-17 DIAGNOSIS — Z888 Allergy status to other drugs, medicaments and biological substances status: Secondary | ICD-10-CM | POA: Diagnosis not present

## 2016-09-17 DIAGNOSIS — R0789 Other chest pain: Secondary | ICD-10-CM | POA: Diagnosis not present

## 2016-09-17 DIAGNOSIS — Z87891 Personal history of nicotine dependence: Secondary | ICD-10-CM | POA: Diagnosis not present

## 2016-09-17 DIAGNOSIS — E279 Disorder of adrenal gland, unspecified: Secondary | ICD-10-CM | POA: Diagnosis not present

## 2016-09-17 DIAGNOSIS — R079 Chest pain, unspecified: Secondary | ICD-10-CM | POA: Diagnosis not present

## 2016-09-17 DIAGNOSIS — K859 Acute pancreatitis without necrosis or infection, unspecified: Secondary | ICD-10-CM | POA: Diagnosis not present

## 2016-09-17 DIAGNOSIS — M797 Fibromyalgia: Secondary | ICD-10-CM | POA: Diagnosis not present

## 2016-09-17 DIAGNOSIS — I2699 Other pulmonary embolism without acute cor pulmonale: Secondary | ICD-10-CM | POA: Diagnosis not present

## 2016-09-17 DIAGNOSIS — R110 Nausea: Secondary | ICD-10-CM | POA: Diagnosis not present

## 2016-09-17 DIAGNOSIS — I10 Essential (primary) hypertension: Secondary | ICD-10-CM | POA: Diagnosis not present

## 2016-09-17 DIAGNOSIS — M549 Dorsalgia, unspecified: Secondary | ICD-10-CM | POA: Diagnosis not present

## 2016-09-17 DIAGNOSIS — D72828 Other elevated white blood cell count: Secondary | ICD-10-CM | POA: Diagnosis not present

## 2016-09-17 DIAGNOSIS — K760 Fatty (change of) liver, not elsewhere classified: Secondary | ICD-10-CM | POA: Diagnosis not present

## 2016-09-17 DIAGNOSIS — Z90710 Acquired absence of both cervix and uterus: Secondary | ICD-10-CM | POA: Diagnosis not present

## 2016-09-17 DIAGNOSIS — R1032 Left lower quadrant pain: Secondary | ICD-10-CM | POA: Diagnosis not present

## 2016-09-17 DIAGNOSIS — Z91040 Latex allergy status: Secondary | ICD-10-CM | POA: Diagnosis not present

## 2016-09-17 DIAGNOSIS — R109 Unspecified abdominal pain: Secondary | ICD-10-CM | POA: Diagnosis not present

## 2016-09-17 DIAGNOSIS — M069 Rheumatoid arthritis, unspecified: Secondary | ICD-10-CM | POA: Diagnosis not present

## 2016-09-17 DIAGNOSIS — R1012 Left upper quadrant pain: Secondary | ICD-10-CM | POA: Diagnosis not present

## 2016-09-17 DIAGNOSIS — Z86711 Personal history of pulmonary embolism: Secondary | ICD-10-CM | POA: Diagnosis not present

## 2016-09-17 DIAGNOSIS — G8929 Other chronic pain: Secondary | ICD-10-CM | POA: Diagnosis not present

## 2016-09-17 DIAGNOSIS — R1013 Epigastric pain: Secondary | ICD-10-CM | POA: Diagnosis not present

## 2016-09-18 DIAGNOSIS — M5126 Other intervertebral disc displacement, lumbar region: Secondary | ICD-10-CM | POA: Diagnosis not present

## 2016-09-18 DIAGNOSIS — M5124 Other intervertebral disc displacement, thoracic region: Secondary | ICD-10-CM | POA: Diagnosis not present

## 2016-10-07 DIAGNOSIS — Z6832 Body mass index (BMI) 32.0-32.9, adult: Secondary | ICD-10-CM | POA: Diagnosis not present

## 2016-10-07 DIAGNOSIS — M5414 Radiculopathy, thoracic region: Secondary | ICD-10-CM | POA: Diagnosis not present

## 2016-10-07 DIAGNOSIS — E279 Disorder of adrenal gland, unspecified: Secondary | ICD-10-CM | POA: Diagnosis not present

## 2016-10-18 DIAGNOSIS — R0781 Pleurodynia: Secondary | ICD-10-CM | POA: Diagnosis not present

## 2016-10-18 DIAGNOSIS — M0579 Rheumatoid arthritis with rheumatoid factor of multiple sites without organ or systems involvement: Secondary | ICD-10-CM | POA: Diagnosis not present

## 2016-10-18 DIAGNOSIS — Z86711 Personal history of pulmonary embolism: Secondary | ICD-10-CM | POA: Diagnosis not present

## 2016-10-18 DIAGNOSIS — E1142 Type 2 diabetes mellitus with diabetic polyneuropathy: Secondary | ICD-10-CM | POA: Diagnosis not present

## 2016-10-18 DIAGNOSIS — Z6833 Body mass index (BMI) 33.0-33.9, adult: Secondary | ICD-10-CM | POA: Diagnosis not present

## 2016-10-18 DIAGNOSIS — E279 Disorder of adrenal gland, unspecified: Secondary | ICD-10-CM | POA: Diagnosis not present

## 2016-10-18 DIAGNOSIS — M5414 Radiculopathy, thoracic region: Secondary | ICD-10-CM | POA: Diagnosis not present

## 2016-10-21 DIAGNOSIS — R0789 Other chest pain: Secondary | ICD-10-CM | POA: Diagnosis not present

## 2016-10-21 DIAGNOSIS — G588 Other specified mononeuropathies: Secondary | ICD-10-CM | POA: Diagnosis not present

## 2016-10-21 DIAGNOSIS — S2239XA Fracture of one rib, unspecified side, initial encounter for closed fracture: Secondary | ICD-10-CM | POA: Diagnosis not present

## 2016-10-21 DIAGNOSIS — E279 Disorder of adrenal gland, unspecified: Secondary | ICD-10-CM | POA: Diagnosis not present

## 2016-10-22 ENCOUNTER — Other Ambulatory Visit (HOSPITAL_COMMUNITY): Payer: Self-pay | Admitting: Pain Medicine

## 2016-10-22 DIAGNOSIS — T148XXA Other injury of unspecified body region, initial encounter: Secondary | ICD-10-CM

## 2016-10-23 ENCOUNTER — Ambulatory Visit (HOSPITAL_COMMUNITY)
Admission: RE | Admit: 2016-10-23 | Discharge: 2016-10-23 | Disposition: A | Discharge status: Home / Self Care | Payer: Medicare Other | Source: Ambulatory Visit | Attending: Pain Medicine | Admitting: Pain Medicine

## 2016-10-23 DIAGNOSIS — T148XXA Other injury of unspecified body region, initial encounter: Secondary | ICD-10-CM

## 2016-10-23 DIAGNOSIS — R911 Solitary pulmonary nodule: Secondary | ICD-10-CM | POA: Insufficient documentation

## 2016-10-23 DIAGNOSIS — R079 Chest pain, unspecified: Secondary | ICD-10-CM | POA: Insufficient documentation

## 2016-10-23 DIAGNOSIS — I2510 Atherosclerotic heart disease of native coronary artery without angina pectoris: Secondary | ICD-10-CM | POA: Diagnosis not present

## 2016-10-23 DIAGNOSIS — I700 Atherosclerosis of aorta: Secondary | ICD-10-CM | POA: Insufficient documentation

## 2016-11-04 DIAGNOSIS — G588 Other specified mononeuropathies: Secondary | ICD-10-CM | POA: Diagnosis not present

## 2016-11-04 DIAGNOSIS — R0789 Other chest pain: Secondary | ICD-10-CM | POA: Diagnosis not present

## 2016-11-07 DIAGNOSIS — M549 Dorsalgia, unspecified: Secondary | ICD-10-CM | POA: Diagnosis not present

## 2016-11-07 DIAGNOSIS — R0781 Pleurodynia: Secondary | ICD-10-CM | POA: Diagnosis not present

## 2016-11-07 DIAGNOSIS — M0579 Rheumatoid arthritis with rheumatoid factor of multiple sites without organ or systems involvement: Secondary | ICD-10-CM | POA: Diagnosis not present

## 2016-11-12 DIAGNOSIS — I8290 Acute embolism and thrombosis of unspecified vein: Secondary | ICD-10-CM | POA: Diagnosis not present

## 2016-11-12 DIAGNOSIS — E782 Mixed hyperlipidemia: Secondary | ICD-10-CM | POA: Diagnosis not present

## 2016-11-12 DIAGNOSIS — E1165 Type 2 diabetes mellitus with hyperglycemia: Secondary | ICD-10-CM | POA: Diagnosis not present

## 2016-11-12 DIAGNOSIS — I2782 Chronic pulmonary embolism: Secondary | ICD-10-CM | POA: Diagnosis not present

## 2016-11-12 DIAGNOSIS — E785 Hyperlipidemia, unspecified: Secondary | ICD-10-CM | POA: Diagnosis not present

## 2016-11-18 DIAGNOSIS — R0781 Pleurodynia: Secondary | ICD-10-CM | POA: Diagnosis not present

## 2016-11-18 DIAGNOSIS — M0579 Rheumatoid arthritis with rheumatoid factor of multiple sites without organ or systems involvement: Secondary | ICD-10-CM | POA: Diagnosis not present

## 2016-11-18 DIAGNOSIS — Z6830 Body mass index (BMI) 30.0-30.9, adult: Secondary | ICD-10-CM | POA: Diagnosis not present

## 2016-11-18 DIAGNOSIS — M549 Dorsalgia, unspecified: Secondary | ICD-10-CM | POA: Diagnosis not present

## 2016-12-03 DIAGNOSIS — M5414 Radiculopathy, thoracic region: Secondary | ICD-10-CM | POA: Diagnosis not present

## 2016-12-03 DIAGNOSIS — M069 Rheumatoid arthritis, unspecified: Secondary | ICD-10-CM | POA: Diagnosis not present

## 2016-12-03 DIAGNOSIS — Z6830 Body mass index (BMI) 30.0-30.9, adult: Secondary | ICD-10-CM | POA: Diagnosis not present

## 2016-12-03 DIAGNOSIS — E1165 Type 2 diabetes mellitus with hyperglycemia: Secondary | ICD-10-CM | POA: Diagnosis not present

## 2016-12-03 DIAGNOSIS — M549 Dorsalgia, unspecified: Secondary | ICD-10-CM | POA: Diagnosis not present

## 2016-12-03 DIAGNOSIS — M797 Fibromyalgia: Secondary | ICD-10-CM | POA: Diagnosis not present

## 2016-12-09 DIAGNOSIS — G588 Other specified mononeuropathies: Secondary | ICD-10-CM | POA: Diagnosis not present

## 2016-12-09 DIAGNOSIS — R0789 Other chest pain: Secondary | ICD-10-CM | POA: Diagnosis not present

## 2016-12-11 ENCOUNTER — Other Ambulatory Visit: Payer: Self-pay | Admitting: General Surgery

## 2016-12-11 DIAGNOSIS — E278 Other specified disorders of adrenal gland: Secondary | ICD-10-CM

## 2016-12-12 ENCOUNTER — Other Ambulatory Visit: Payer: Self-pay | Admitting: General Surgery

## 2016-12-12 ENCOUNTER — Other Ambulatory Visit (HOSPITAL_COMMUNITY): Payer: Self-pay | Admitting: General Surgery

## 2016-12-12 DIAGNOSIS — D3500 Benign neoplasm of unspecified adrenal gland: Secondary | ICD-10-CM

## 2016-12-12 DIAGNOSIS — R1900 Intra-abdominal and pelvic swelling, mass and lump, unspecified site: Secondary | ICD-10-CM

## 2016-12-12 DIAGNOSIS — E278 Other specified disorders of adrenal gland: Secondary | ICD-10-CM

## 2016-12-17 ENCOUNTER — Ambulatory Visit (HOSPITAL_COMMUNITY)
Admission: RE | Admit: 2016-12-17 | Discharge: 2016-12-17 | Disposition: A | Discharge status: Home / Self Care | Payer: Medicare Other | Source: Ambulatory Visit | Attending: General Surgery | Admitting: General Surgery

## 2016-12-17 DIAGNOSIS — E279 Disorder of adrenal gland, unspecified: Secondary | ICD-10-CM | POA: Insufficient documentation

## 2016-12-17 DIAGNOSIS — R1900 Intra-abdominal and pelvic swelling, mass and lump, unspecified site: Secondary | ICD-10-CM | POA: Insufficient documentation

## 2016-12-17 DIAGNOSIS — N2889 Other specified disorders of kidney and ureter: Secondary | ICD-10-CM | POA: Diagnosis not present

## 2017-01-06 DIAGNOSIS — R0789 Other chest pain: Secondary | ICD-10-CM | POA: Diagnosis not present

## 2017-01-06 DIAGNOSIS — G588 Other specified mononeuropathies: Secondary | ICD-10-CM | POA: Diagnosis not present

## 2017-01-07 DIAGNOSIS — Z6830 Body mass index (BMI) 30.0-30.9, adult: Secondary | ICD-10-CM | POA: Diagnosis not present

## 2017-01-07 DIAGNOSIS — D179 Benign lipomatous neoplasm, unspecified: Secondary | ICD-10-CM | POA: Diagnosis not present

## 2017-01-27 ENCOUNTER — Other Ambulatory Visit: Payer: Self-pay | Admitting: "Endocrinology

## 2017-01-29 DIAGNOSIS — E279 Disorder of adrenal gland, unspecified: Secondary | ICD-10-CM | POA: Diagnosis not present

## 2017-02-10 DIAGNOSIS — M797 Fibromyalgia: Secondary | ICD-10-CM | POA: Diagnosis not present

## 2017-02-10 DIAGNOSIS — M549 Dorsalgia, unspecified: Secondary | ICD-10-CM | POA: Diagnosis not present

## 2017-02-10 DIAGNOSIS — Z6830 Body mass index (BMI) 30.0-30.9, adult: Secondary | ICD-10-CM | POA: Diagnosis not present

## 2017-02-10 DIAGNOSIS — M5414 Radiculopathy, thoracic region: Secondary | ICD-10-CM | POA: Diagnosis not present

## 2017-02-10 DIAGNOSIS — M069 Rheumatoid arthritis, unspecified: Secondary | ICD-10-CM | POA: Diagnosis not present

## 2017-02-10 DIAGNOSIS — E1165 Type 2 diabetes mellitus with hyperglycemia: Secondary | ICD-10-CM | POA: Diagnosis not present

## 2017-02-14 DIAGNOSIS — G588 Other specified mononeuropathies: Secondary | ICD-10-CM | POA: Diagnosis not present

## 2017-02-14 DIAGNOSIS — R0789 Other chest pain: Secondary | ICD-10-CM | POA: Diagnosis not present

## 2017-03-11 DIAGNOSIS — M5414 Radiculopathy, thoracic region: Secondary | ICD-10-CM | POA: Diagnosis not present

## 2017-03-11 DIAGNOSIS — Z6830 Body mass index (BMI) 30.0-30.9, adult: Secondary | ICD-10-CM | POA: Diagnosis not present

## 2017-03-11 DIAGNOSIS — M797 Fibromyalgia: Secondary | ICD-10-CM | POA: Diagnosis not present

## 2017-03-11 DIAGNOSIS — M069 Rheumatoid arthritis, unspecified: Secondary | ICD-10-CM | POA: Diagnosis not present

## 2017-03-11 DIAGNOSIS — E1165 Type 2 diabetes mellitus with hyperglycemia: Secondary | ICD-10-CM | POA: Diagnosis not present

## 2017-03-11 DIAGNOSIS — M549 Dorsalgia, unspecified: Secondary | ICD-10-CM | POA: Diagnosis not present

## 2017-03-19 DIAGNOSIS — G588 Other specified mononeuropathies: Secondary | ICD-10-CM | POA: Diagnosis not present

## 2017-03-19 DIAGNOSIS — R0789 Other chest pain: Secondary | ICD-10-CM | POA: Diagnosis not present

## 2017-04-07 DIAGNOSIS — M797 Fibromyalgia: Secondary | ICD-10-CM | POA: Diagnosis not present

## 2017-04-07 DIAGNOSIS — M0579 Rheumatoid arthritis with rheumatoid factor of multiple sites without organ or systems involvement: Secondary | ICD-10-CM | POA: Diagnosis not present

## 2017-04-07 DIAGNOSIS — R21 Rash and other nonspecific skin eruption: Secondary | ICD-10-CM | POA: Diagnosis not present

## 2017-04-07 DIAGNOSIS — M1990 Unspecified osteoarthritis, unspecified site: Secondary | ICD-10-CM | POA: Diagnosis not present

## 2017-04-07 DIAGNOSIS — Z79899 Other long term (current) drug therapy: Secondary | ICD-10-CM | POA: Diagnosis not present

## 2017-04-08 DIAGNOSIS — M5414 Radiculopathy, thoracic region: Secondary | ICD-10-CM | POA: Diagnosis not present

## 2017-04-08 DIAGNOSIS — M069 Rheumatoid arthritis, unspecified: Secondary | ICD-10-CM | POA: Diagnosis not present

## 2017-04-08 DIAGNOSIS — E1165 Type 2 diabetes mellitus with hyperglycemia: Secondary | ICD-10-CM | POA: Diagnosis not present

## 2017-04-08 DIAGNOSIS — Z1389 Encounter for screening for other disorder: Secondary | ICD-10-CM | POA: Diagnosis not present

## 2017-04-08 DIAGNOSIS — M549 Dorsalgia, unspecified: Secondary | ICD-10-CM | POA: Diagnosis not present

## 2017-04-08 DIAGNOSIS — Z6830 Body mass index (BMI) 30.0-30.9, adult: Secondary | ICD-10-CM | POA: Diagnosis not present

## 2017-04-08 DIAGNOSIS — M797 Fibromyalgia: Secondary | ICD-10-CM | POA: Diagnosis not present

## 2017-04-16 DIAGNOSIS — R0789 Other chest pain: Secondary | ICD-10-CM | POA: Diagnosis not present

## 2017-04-16 DIAGNOSIS — G588 Other specified mononeuropathies: Secondary | ICD-10-CM | POA: Diagnosis not present

## 2017-04-16 DIAGNOSIS — G894 Chronic pain syndrome: Secondary | ICD-10-CM | POA: Diagnosis not present

## 2017-04-21 ENCOUNTER — Other Ambulatory Visit: Payer: Self-pay | Admitting: "Endocrinology

## 2017-04-30 ENCOUNTER — Encounter: Payer: Self-pay | Admitting: Neurology

## 2017-04-30 ENCOUNTER — Encounter (INDEPENDENT_AMBULATORY_CARE_PROVIDER_SITE_OTHER): Payer: Self-pay

## 2017-04-30 ENCOUNTER — Ambulatory Visit (INDEPENDENT_AMBULATORY_CARE_PROVIDER_SITE_OTHER): Payer: Medicare Other | Admitting: Neurology

## 2017-04-30 DIAGNOSIS — M5410 Radiculopathy, site unspecified: Secondary | ICD-10-CM

## 2017-04-30 DIAGNOSIS — E1149 Type 2 diabetes mellitus with other diabetic neurological complication: Secondary | ICD-10-CM | POA: Diagnosis not present

## 2017-04-30 HISTORY — DX: Type 2 diabetes mellitus with other diabetic neurological complication: E11.49

## 2017-04-30 MED ORDER — OXCARBAZEPINE 150 MG PO TABS: 150.0000 mg | ORAL_TABLET | Freq: Two times a day (BID) | ORAL | 2 refills | Status: DC

## 2017-04-30 NOTE — Patient Instructions (Signed)
   We will start Trileptal 150 mg twice a day, if the pain is still significant, call in 2 weeks for a dose adjustment.

## 2017-04-30 NOTE — Progress Notes (Signed)
Reason for visit: Left chest wall pain  Referring physician: Dr. Hurman Horn [name] is a 62 y.o. female  History of present illness:  [name] is a 62 year old right-handed white female with a history of rheumatoid arthritis and diabetes.  The patient indicated that she had pneumonia in June 2018, she had severe coughing that lasted several months.  Around this time, she developed a discomfort that came around from the back to the front of the chest around the thoracic level 5 or 6.  The patient has had some sensitivity of the skin in this area.  The patient claims that she has had some radiographic studies, she is not sure whether or not she had MRI evaluation of the thoracic spine or not.  She was sent to [facility], she underwent intercostal injections with some benefit of the pain in the back area, but she continues to have some anterior chest pain that is associated with a baseline dull achy pain with some overriding sharp lancinating pains that occur.  This keeps her awake at night.  She also has a diabetic peripheral neuropathy and takes gabapentin and Cymbalta for this with some benefit.  The patient does have some mild gait instability, she has not had any falls.  She denies issues controlling the bowels or the bladder.  She denies any pain with taking a deep breath.  The patient has been placed on a fentanyl patch and she is on oxycodone.  She denies any weakness of the extremities, she denies low back pain or neck pain.  She did not note a rash around the chest at the onset of the pain issue.  The patient was given a trial on a Lidoderm patch which did not help.  She is sent to this office for further evaluation.  She is no longer followed through the pain center.  Past Medical History:  Diagnosis Date  . Bradycardia   . High blood pressure   . High cholesterol     Past Surgical History:  Procedure Laterality Date  . ABDOMINAL HYSTERECTOMY    .  APPENDECTOMY    . hemorhoidectomy    . IVC FILTER INSERTION N/A 09/12/2016   Procedure: IVC Filter Insertion;  Surgeon: Conrad [place], MD;  Location: [facility];  Service: Cardiovascular;  Laterality: N/A;  . NECK SURGERY    . REFRACTIVE SURGERY    . TONSILLECTOMY      Family History  Problem Relation Age of Onset  . Hypertension Mother   . Cancer Mother   . Heart Problems Mother   . Heart Problems Father   . Diabetes Maternal Grandfather     Social history:  reports that she has quit smoking. she has never used smokeless tobacco. She reports that she does not drink alcohol or use drugs.  Medications:  Prior to Admission medications   Medication Sig Start Date End Date Taking? Authorizing Provider  acetaminophen (TYLENOL) 500 MG tablet Take 500 mg by mouth every 6 (six) hours as needed.    [provider]  apixaban (ELIQUIS) 5 MG TABS tablet Take 5 mg by mouth 2 (two) times daily.    [provider]  docusate sodium (COLACE) 100 MG capsule Take 100 mg by mouth daily.    [provider]  Dulaglutide (TRULICITY) 1.5 ZO/1.0RU SOPN Inject 0.75 mg elemental calcium/kg/hr into the skin every Thursday.    [provider]  DULoxetine (CYMBALTA) 60 MG capsule Take  60 mg by mouth every evening.     [provider]  enoxaparin (LOVENOX) 80 MG/0.8ML injection Inject 80 mg into the skin daily.    [provider]  ezetimibe (ZETIA) 10 MG tablet TAKE ONE TABLET BY MOUTH AT BEDTIME. 11/01/15   Katrina, [name], MD  folic acid (FOLVITE) 1 MG tablet Take 1 mg by mouth daily.    [provider]  gabapentin (NEURONTIN) 600 MG tablet Take 600 mg by mouth 3 (three) times daily. 9a 3p 9p    [provider]  insulin glargine (LANTUS) 100 UNIT/ML injection Inject 0.6 mLs (60 Units total) into the skin at bedtime. Patient taking differently: Inject 26 Units into the skin 2 (two) times daily.  11/22/15   Katrina Anger, MD    insulin lispro (HUMALOG) 100 UNIT/ML injection Inject 0.2-0.26 mLs (20-26 Units total) into the skin 3 (three) times daily with meals. 10/05/15   Katrina Anger, MD  levothyroxine (SYNTHROID, LEVOTHROID) 88 MCG tablet Take 88 mcg by mouth daily before breakfast.    [provider]  metFORMIN (GLUCOPHAGE-XR) 500 MG 24 hr tablet Take 1,000 mg by mouth 2 (two) times daily.    [provider]  methotrexate (RHEUMATREX) 2.5 MG tablet Take 2.5 mg by mouth once a week. Caution:Chemotherapy. Protect from light. Takes 7 tablets every Monday night only    [provider]  olmesartan-hydrochlorothiazide (BENICAR HCT) 40-25 MG per tablet Take 0.5 tablets by mouth daily.     [provider]  OXcarbazepine (TRILEPTAL) 150 MG tablet Take 1 tablet (150 mg total) by mouth 2 (two) times daily. 04/30/17   Katrina Ducking, MD  oxyCODONE-acetaminophen (PERCOCET) 10-325 MG tablet Take 1 tablet by mouth every 8 (eight) hours as needed for pain.    [provider]  pantoprazole (PROTONIX) 40 MG tablet Take 40 mg by mouth 2 (two) times daily.    [provider]  TRULICITY 1.5 XB/2.8UX SOPN INJECT 1.5 SUBCUTANEOUSLY ONCE A WEEK. 01/28/17   Katrina Anger, MD     No Known Allergies  ROS:  Out of a complete 14 system review of symptoms, the patient complains only of the following symptoms, and all other reviewed systems are negative.  Chest pain Aching muscles Runny nose Not enough sleep, decreased energy, change in appetite  Blood pressure 135/85, pulse 92, height 5\' 3"  (1.6 m), weight 169 lb (76.7 kg).  Physical Exam  General: The patient is alert and cooperative at the time of the examination.  The patient is moderately obese.  Eyes: Pupils are equal, round, and reactive to light. Discs are flat bilaterally.  Neck: The neck is supple, no carotid bruits are noted.  Respiratory: The respiratory examination is clear.  Cardiovascular: The  cardiovascular examination reveals a regular rate and rhythm, no obvious murmurs or rubs are noted.  Neuromuscular: The patient has full range of movement of the low back without discomfort.  Skin: Extremities are without significant edema.  Neurologic Exam  Mental status: The patient is alert and oriented x 3 at the time of the examination. The patient has apparent normal recent and remote memory, with an apparently normal attention span and concentration ability.  Cranial nerves: Facial symmetry is present. There is good sensation of the face to pinprick and soft touch bilaterally. The strength of the facial muscles and the muscles to head turning and shoulder shrug are normal bilaterally. Speech is well enunciated, no aphasia or dysarthria is noted. Extraocular movements are  full. Visual fields are full. The tongue is midline, and the patient has symmetric elevation of the soft palate. No obvious hearing deficits are noted.  Motor: The motor testing reveals 5 over 5 strength of all 4 extremities. Good symmetric motor tone is noted throughout.  Sensory: Sensory testing is intact to pinprick, soft touch, vibration sensation, and position sense on all 4 extremities. No evidence of extinction is noted.  Coordination: Cerebellar testing reveals good finger-nose-finger and heel-to-shin bilaterally.  Gait and station: Gait is normal. Tandem gait is slightly unsteady. Romberg is negative. No drift is seen.  Reflexes: Deep tendon reflexes are symmetric and normal bilaterally, with exception of some decrease in ankle jerk reflexes bilaterally. Toes are downgoing bilaterally.   Assessment/Plan:  1.  Left anterior chest wall pain  The patient does have a history of diabetes.  The patient had a radicular pattern pain around the left chest at the T5 or 6 level.  The posterior portions of this pain have improved, but she continues to have anterior discomfort with lancinating pains.  She is on Cymbalta  and gabapentin.  The patient is unsure whether or not she had MRI evaluation of the thoracic spine.  We will contact [facility], if MRI has not been done we will get this set up.  The onset of the pain came around the time of a pneumonia with significant coughing.  It is possible that the patient could have a disc herniation.  Shingles without a rash could be a possibility, and the patient also could have had a diabetic radiculitis syndrome.  The patient will be placed on Trileptal which does not interact with Eliquis.  The patient will contact me in 2 weeks if the medication is tolerated but not effective, the dose will be increased.  Otherwise, the patient will follow-up in 4 months.  Addendum: I have received the report of the MRI of the thoracic and lumbar spine that was done on 18 September 2016.  The thoracic spine was unremarkable with exception of a shallow right central disc protrusion that minimally indents the thecal sac ventrally, no stenosis was seen.  There is no evidence of any nerve root impingement.  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  A mild disc bulge was noted at the L4-3 level through the L4-5 levels without significant spinal stenosis.  Jill Alexanders MD 04/30/2017 7:51 AM  [facility] [address] [place], [place] [address]  Phone [phone] Fax [phone]

## 2017-05-06 DIAGNOSIS — D519 Vitamin B12 deficiency anemia, unspecified: Secondary | ICD-10-CM | POA: Diagnosis not present

## 2017-05-06 DIAGNOSIS — M0579 Rheumatoid arthritis with rheumatoid factor of multiple sites without organ or systems involvement: Secondary | ICD-10-CM | POA: Diagnosis not present

## 2017-05-06 DIAGNOSIS — E039 Hypothyroidism, unspecified: Secondary | ICD-10-CM | POA: Diagnosis not present

## 2017-05-06 DIAGNOSIS — E1142 Type 2 diabetes mellitus with diabetic polyneuropathy: Secondary | ICD-10-CM | POA: Diagnosis not present

## 2017-05-06 DIAGNOSIS — Z6830 Body mass index (BMI) 30.0-30.9, adult: Secondary | ICD-10-CM | POA: Diagnosis not present

## 2017-05-06 DIAGNOSIS — M5414 Radiculopathy, thoracic region: Secondary | ICD-10-CM | POA: Diagnosis not present

## 2017-05-14 ENCOUNTER — Telehealth: Payer: Self-pay | Admitting: Neurology

## 2017-05-14 MED ORDER — OXCARBAZEPINE 300 MG PO TABS: 300.0000 mg | ORAL_TABLET | Freq: Two times a day (BID) | ORAL | 3 refills | Status: DC

## 2017-05-14 NOTE — Telephone Encounter (Signed)
I have called the patient.  The patient is tolerating the Trileptal taking 150 mg twice daily.  This is not helping her pain significantly, we will go up to 300 mg twice daily.  I will send in a prescription.

## 2017-05-14 NOTE — Telephone Encounter (Signed)
Pt called she has not noticed any improvement on the current dose ofOXcarbazepine (TRILEPTAL) 150 MG tablet and is wanting to increase it. Please call to advise. She will leaving at 1pm today, can leave instructions on home VM if she is not there.

## 2017-05-19 ENCOUNTER — Other Ambulatory Visit: Payer: Self-pay | Admitting: "Endocrinology

## 2017-05-20 MED ORDER — LEVETIRACETAM 250 MG PO TABS: 250.0000 mg | ORAL_TABLET | Freq: Two times a day (BID) | ORAL | 1 refills | Status: DC

## 2017-05-20 NOTE — Telephone Encounter (Signed)
I called the patient.  The patient indicates that she went up on Trileptal the neuropathy symptoms in her feet worsened.  She will go down on the Trileptal to 300 mg at night for 1 week and then stop the drug.  I will start Keppra 250 mg twice daily.  The patient will call for any dose adjustments.

## 2017-05-20 NOTE — Telephone Encounter (Signed)
Pt called stating that since doubling Trileptal last Wednesday(3/20) the burning sensation in her feet and feeling of needles throughout her legs have returned. Please call to advise.

## 2017-05-20 NOTE — Addendum Note (Signed)
Addended by: [name] on: 05/20/2017 01:30 PM   Modules accepted: Orders

## 2017-06-06 DIAGNOSIS — I2699 Other pulmonary embolism without acute cor pulmonale: Secondary | ICD-10-CM | POA: Diagnosis not present

## 2017-06-06 DIAGNOSIS — M0579 Rheumatoid arthritis with rheumatoid factor of multiple sites without organ or systems involvement: Secondary | ICD-10-CM | POA: Diagnosis not present

## 2017-06-06 DIAGNOSIS — M549 Dorsalgia, unspecified: Secondary | ICD-10-CM | POA: Diagnosis not present

## 2017-06-06 DIAGNOSIS — M797 Fibromyalgia: Secondary | ICD-10-CM | POA: Diagnosis not present

## 2017-06-06 DIAGNOSIS — Z6829 Body mass index (BMI) 29.0-29.9, adult: Secondary | ICD-10-CM | POA: Diagnosis not present

## 2017-06-06 DIAGNOSIS — Z0001 Encounter for general adult medical examination with abnormal findings: Secondary | ICD-10-CM | POA: Diagnosis not present

## 2017-06-06 DIAGNOSIS — M7530 Calcific tendinitis of unspecified shoulder: Secondary | ICD-10-CM | POA: Diagnosis not present

## 2017-06-09 NOTE — Telephone Encounter (Signed)
Patient calling stating levETIRAcetam (KEPPRA) 250 MG tablet is not helping much with pain. Can dosage be increased or try a another medication. Please call and discuss. The patient uses [facility] in [place].

## 2017-06-09 NOTE — Telephone Encounter (Signed)
I called the patient.  The current dose of Keppra is not effective, will go up to 500 mg twice daily, if she is tolerating the drug we will continue to go up on the dose.

## 2017-06-13 ENCOUNTER — Ambulatory Visit: Payer: 59 | Admitting: Neurology

## 2017-06-16 ENCOUNTER — Telehealth: Payer: Self-pay | Admitting: Neurology

## 2017-06-16 MED ORDER — LEVETIRACETAM 500 MG PO TABS: 500.0000 mg | ORAL_TABLET | Freq: Two times a day (BID) | ORAL | 3 refills | Status: DC

## 2017-06-16 NOTE — Telephone Encounter (Signed)
Pt request refill for levetiracetam 500mg  sent to [facility]. Pt states this new RX has not been sent yet, see tele note 3/20. Thank you

## 2017-06-16 NOTE — Telephone Encounter (Signed)
A prescription for the 500 mg Keppra tablets was sent in.

## 2017-06-19 DIAGNOSIS — H35411 Lattice degeneration of retina, right eye: Secondary | ICD-10-CM | POA: Diagnosis not present

## 2017-06-19 DIAGNOSIS — E119 Type 2 diabetes mellitus without complications: Secondary | ICD-10-CM | POA: Diagnosis not present

## 2017-06-19 DIAGNOSIS — H2513 Age-related nuclear cataract, bilateral: Secondary | ICD-10-CM | POA: Diagnosis not present

## 2017-06-19 DIAGNOSIS — Z7984 Long term (current) use of oral hypoglycemic drugs: Secondary | ICD-10-CM | POA: Diagnosis not present

## 2017-06-19 DIAGNOSIS — H524 Presbyopia: Secondary | ICD-10-CM | POA: Diagnosis not present

## 2017-07-03 DIAGNOSIS — I10 Essential (primary) hypertension: Secondary | ICD-10-CM | POA: Diagnosis not present

## 2017-07-03 DIAGNOSIS — Z86711 Personal history of pulmonary embolism: Secondary | ICD-10-CM | POA: Diagnosis not present

## 2017-07-03 DIAGNOSIS — G619 Inflammatory polyneuropathy, unspecified: Secondary | ICD-10-CM | POA: Diagnosis not present

## 2017-07-03 DIAGNOSIS — M797 Fibromyalgia: Secondary | ICD-10-CM | POA: Diagnosis not present

## 2017-07-03 DIAGNOSIS — Z6830 Body mass index (BMI) 30.0-30.9, adult: Secondary | ICD-10-CM | POA: Diagnosis not present

## 2017-07-03 DIAGNOSIS — E1142 Type 2 diabetes mellitus with diabetic polyneuropathy: Secondary | ICD-10-CM | POA: Diagnosis not present

## 2017-07-07 ENCOUNTER — Telehealth: Payer: Self-pay | Admitting: Neurology

## 2017-07-07 MED ORDER — LEVETIRACETAM 750 MG PO TABS
750.0000 mg | ORAL_TABLET | Freq: Two times a day (BID) | ORAL | 3 refills | Status: DC
Start: 2017-07-07 — End: 2017-08-04

## 2017-07-07 NOTE — Telephone Encounter (Signed)
Pt called stating that her PCP will be winging her off of all pain medication and would liek the dosing for levETIRAcetam (KEPPRA) 500 MG tablet raised to help balance everything out. Please call to advise

## 2017-07-07 NOTE — Telephone Encounter (Signed)
I called the patient.  The patient is tapering off of the opiate medications, she is having some increased pain, in the past she could not tolerate Trileptal, she is already on gabapentin.  I will go up on the Keppra taken 750 mg twice daily.

## 2017-07-16 ENCOUNTER — Other Ambulatory Visit: Payer: Self-pay | Admitting: General Surgery

## 2017-07-16 DIAGNOSIS — E278 Other specified disorders of adrenal gland: Secondary | ICD-10-CM

## 2017-07-20 DIAGNOSIS — J181 Lobar pneumonia, unspecified organism: Secondary | ICD-10-CM | POA: Diagnosis not present

## 2017-07-20 DIAGNOSIS — E039 Hypothyroidism, unspecified: Secondary | ICD-10-CM | POA: Diagnosis not present

## 2017-07-20 DIAGNOSIS — J690 Pneumonitis due to inhalation of food and vomit: Secondary | ICD-10-CM | POA: Diagnosis not present

## 2017-07-20 DIAGNOSIS — J13 Pneumonia due to Streptococcus pneumoniae: Secondary | ICD-10-CM | POA: Diagnosis not present

## 2017-07-20 DIAGNOSIS — A419 Sepsis, unspecified organism: Secondary | ICD-10-CM | POA: Diagnosis not present

## 2017-07-20 DIAGNOSIS — I10 Essential (primary) hypertension: Secondary | ICD-10-CM | POA: Diagnosis not present

## 2017-07-20 DIAGNOSIS — E1140 Type 2 diabetes mellitus with diabetic neuropathy, unspecified: Secondary | ICD-10-CM | POA: Diagnosis not present

## 2017-07-20 DIAGNOSIS — N390 Urinary tract infection, site not specified: Secondary | ICD-10-CM | POA: Diagnosis not present

## 2017-07-20 DIAGNOSIS — R509 Fever, unspecified: Secondary | ICD-10-CM | POA: Diagnosis not present

## 2017-07-20 DIAGNOSIS — Z7984 Long term (current) use of oral hypoglycemic drugs: Secondary | ICD-10-CM | POA: Diagnosis not present

## 2017-07-20 DIAGNOSIS — J189 Pneumonia, unspecified organism: Secondary | ICD-10-CM | POA: Diagnosis not present

## 2017-07-21 DIAGNOSIS — A419 Sepsis, unspecified organism: Secondary | ICD-10-CM | POA: Diagnosis present

## 2017-07-21 DIAGNOSIS — Z7901 Long term (current) use of anticoagulants: Secondary | ICD-10-CM | POA: Diagnosis not present

## 2017-07-21 DIAGNOSIS — N281 Cyst of kidney, acquired: Secondary | ICD-10-CM | POA: Diagnosis not present

## 2017-07-21 DIAGNOSIS — E1140 Type 2 diabetes mellitus with diabetic neuropathy, unspecified: Secondary | ICD-10-CM | POA: Diagnosis present

## 2017-07-21 DIAGNOSIS — M797 Fibromyalgia: Secondary | ICD-10-CM | POA: Diagnosis present

## 2017-07-21 DIAGNOSIS — Z86718 Personal history of other venous thrombosis and embolism: Secondary | ICD-10-CM | POA: Diagnosis not present

## 2017-07-21 DIAGNOSIS — E039 Hypothyroidism, unspecified: Secondary | ICD-10-CM | POA: Diagnosis present

## 2017-07-21 DIAGNOSIS — R911 Solitary pulmonary nodule: Secondary | ICD-10-CM | POA: Diagnosis not present

## 2017-07-21 DIAGNOSIS — I10 Essential (primary) hypertension: Secondary | ICD-10-CM | POA: Diagnosis present

## 2017-07-21 DIAGNOSIS — Z87891 Personal history of nicotine dependence: Secondary | ICD-10-CM | POA: Diagnosis not present

## 2017-07-21 DIAGNOSIS — E119 Type 2 diabetes mellitus without complications: Secondary | ICD-10-CM | POA: Diagnosis not present

## 2017-07-21 DIAGNOSIS — J13 Pneumonia due to Streptococcus pneumoniae: Secondary | ICD-10-CM | POA: Diagnosis present

## 2017-07-21 DIAGNOSIS — J181 Lobar pneumonia, unspecified organism: Secondary | ICD-10-CM | POA: Diagnosis not present

## 2017-07-21 DIAGNOSIS — Z86711 Personal history of pulmonary embolism: Secondary | ICD-10-CM | POA: Diagnosis not present

## 2017-07-21 DIAGNOSIS — K219 Gastro-esophageal reflux disease without esophagitis: Secondary | ICD-10-CM | POA: Diagnosis present

## 2017-07-21 DIAGNOSIS — M069 Rheumatoid arthritis, unspecified: Secondary | ICD-10-CM | POA: Diagnosis present

## 2017-07-21 DIAGNOSIS — Z6832 Body mass index (BMI) 32.0-32.9, adult: Secondary | ICD-10-CM | POA: Diagnosis not present

## 2017-07-21 DIAGNOSIS — E669 Obesity, unspecified: Secondary | ICD-10-CM | POA: Diagnosis present

## 2017-07-21 DIAGNOSIS — R509 Fever, unspecified: Secondary | ICD-10-CM | POA: Diagnosis not present

## 2017-07-21 DIAGNOSIS — Z7984 Long term (current) use of oral hypoglycemic drugs: Secondary | ICD-10-CM | POA: Diagnosis not present

## 2017-07-21 DIAGNOSIS — J189 Pneumonia, unspecified organism: Secondary | ICD-10-CM | POA: Diagnosis not present

## 2017-08-01 DIAGNOSIS — M5414 Radiculopathy, thoracic region: Secondary | ICD-10-CM | POA: Diagnosis not present

## 2017-08-01 DIAGNOSIS — Z6829 Body mass index (BMI) 29.0-29.9, adult: Secondary | ICD-10-CM | POA: Diagnosis not present

## 2017-08-01 DIAGNOSIS — J189 Pneumonia, unspecified organism: Secondary | ICD-10-CM | POA: Diagnosis not present

## 2017-08-01 DIAGNOSIS — I10 Essential (primary) hypertension: Secondary | ICD-10-CM | POA: Diagnosis not present

## 2017-08-01 DIAGNOSIS — M0579 Rheumatoid arthritis with rheumatoid factor of multiple sites without organ or systems involvement: Secondary | ICD-10-CM | POA: Diagnosis not present

## 2017-08-01 DIAGNOSIS — I2782 Chronic pulmonary embolism: Secondary | ICD-10-CM | POA: Diagnosis not present

## 2017-08-01 DIAGNOSIS — M797 Fibromyalgia: Secondary | ICD-10-CM | POA: Diagnosis not present

## 2017-08-04 ENCOUNTER — Telehealth: Payer: Self-pay | Admitting: Neurology

## 2017-08-04 MED ORDER — LEVETIRACETAM 750 MG PO TABS
ORAL_TABLET | ORAL | 3 refills | Status: DC
Start: 2017-08-04 — End: 2017-12-06

## 2017-08-04 NOTE — Telephone Encounter (Signed)
Pt called stating her PCP has cut her dosing in half for oxycodone. Pt requesting a call to discuss raising the dosage for levETIRAcetam (KEPPRA) 750 MG tablet to help maintain her pain. Please call to advise

## 2017-08-04 NOTE — Telephone Encounter (Signed)
I called the patient.  The pain from the diabetic radiculitis has worsened coming off of the opiate medications.  The patient is on 750 mg of Keppra twice daily, she believes that this has been beneficial, we will go up on the dose taking 1 in the morning and 2 in the evening.

## 2017-08-14 DIAGNOSIS — E279 Disorder of adrenal gland, unspecified: Secondary | ICD-10-CM | POA: Diagnosis not present

## 2017-09-04 DIAGNOSIS — I8290 Acute embolism and thrombosis of unspecified vein: Secondary | ICD-10-CM | POA: Diagnosis not present

## 2017-09-04 DIAGNOSIS — M797 Fibromyalgia: Secondary | ICD-10-CM | POA: Diagnosis not present

## 2017-09-04 DIAGNOSIS — E1142 Type 2 diabetes mellitus with diabetic polyneuropathy: Secondary | ICD-10-CM | POA: Diagnosis not present

## 2017-09-04 DIAGNOSIS — M549 Dorsalgia, unspecified: Secondary | ICD-10-CM | POA: Diagnosis not present

## 2017-09-04 DIAGNOSIS — E1165 Type 2 diabetes mellitus with hyperglycemia: Secondary | ICD-10-CM | POA: Diagnosis not present

## 2017-09-04 DIAGNOSIS — I10 Essential (primary) hypertension: Secondary | ICD-10-CM | POA: Diagnosis not present

## 2017-09-04 DIAGNOSIS — Z6829 Body mass index (BMI) 29.0-29.9, adult: Secondary | ICD-10-CM | POA: Diagnosis not present

## 2017-09-04 DIAGNOSIS — E039 Hypothyroidism, unspecified: Secondary | ICD-10-CM | POA: Diagnosis not present

## 2017-09-09 DIAGNOSIS — G619 Inflammatory polyneuropathy, unspecified: Secondary | ICD-10-CM | POA: Diagnosis not present

## 2017-09-09 DIAGNOSIS — Z6829 Body mass index (BMI) 29.0-29.9, adult: Secondary | ICD-10-CM | POA: Diagnosis not present

## 2017-09-17 ENCOUNTER — Ambulatory Visit (INDEPENDENT_AMBULATORY_CARE_PROVIDER_SITE_OTHER): Payer: Medicare Other | Admitting: Neurology

## 2017-09-17 ENCOUNTER — Encounter: Payer: Self-pay | Admitting: Neurology

## 2017-09-17 ENCOUNTER — Other Ambulatory Visit: Payer: Self-pay

## 2017-09-17 VITALS — BP 141/78 | HR 106 | Ht 63.0 in | Wt 165.5 lb

## 2017-09-17 DIAGNOSIS — E1149 Type 2 diabetes mellitus with other diabetic neurological complication: Secondary | ICD-10-CM

## 2017-09-17 DIAGNOSIS — M5410 Radiculopathy, site unspecified: Secondary | ICD-10-CM

## 2017-09-17 NOTE — Progress Notes (Signed)
Reason for visit: Diabetic radiculitis  [name] is an 62 y.o. female  History of present illness:  [name] is a 62 year old right-handed white female with a history of diabetes and a left T5 or T6 level radiculitis.  MRI of the thoracic spine did not show a compressive lesion.  The patient is felt to have a diabetic radiculitis.  She did not tolerate Trileptal as this increased burning sensations in her feet associated with her diabetic neuropathy.  The patient recently has been placed on amitriptyline which has been helpful, the use of Keppra has also been helpful.  The patient still has hypersensitivity of the skin in the thoracic dermatome, she occasionally may have lancinating pains.  She is sleeping fairly well at night, she is on a slow taper of oxycodone, currently taking 1/2 tablet 3 times daily.  The patient takes capsicin topical cream for her feet.  She returns for an evaluation.  The patient also is on Cymbalta and gabapentin.  Past Medical History:  Diagnosis Date  . Bradycardia   . Diabetic radiculopathy ([place]) 04/30/2017  . High blood pressure   . High cholesterol     Past Surgical History:  Procedure Laterality Date  . ABDOMINAL HYSTERECTOMY    . APPENDECTOMY    . hemorhoidectomy    . IVC FILTER INSERTION N/A 09/12/2016   Procedure: IVC Filter Insertion;  Surgeon: Conrad [place], MD;  Location: [place] [facility];  Service: Cardiovascular;  Laterality: N/A;  . NECK SURGERY    . REFRACTIVE SURGERY    . TONSILLECTOMY      Family History  Problem Relation Age of Onset  . Hypertension Mother   . Cancer Mother   . Heart Problems Mother   . Heart Problems Father   . Diabetes Maternal Grandfather     Social history:  reports that she has quit smoking. She has never used smokeless tobacco. She reports that she does not drink alcohol or use drugs.   No Known Allergies  Medications:  Prior to Admission medications   Medication Sig Start Date End  Date Taking? Authorizing Provider  acetaminophen (TYLENOL) 500 MG tablet Take 500 mg by mouth every 6 (six) hours as needed.   Yes [provider]  amitriptyline (ELAVIL) 25 MG tablet Take 25 mg by mouth at bedtime.   Yes [provider]  apixaban (ELIQUIS) 5 MG TABS tablet Take 5 mg by mouth 2 (two) times daily.   Yes [provider]  Capsaicin (CAPZASIN EX) Apply 1 application topically as needed (for legs/feet).   Yes [provider]  docusate sodium (COLACE) 100 MG capsule Take 100 mg by mouth daily.   Yes [provider]  DULoxetine (CYMBALTA) 60 MG capsule Take 60 mg by mouth every evening.    Yes [provider]  ezetimibe (ZETIA) 10 MG tablet TAKE ONE TABLET BY MOUTH AT BEDTIME. 11/01/15  Yes [name], [name], MD  folic acid (FOLVITE) 1 MG tablet Take 1 mg by mouth daily.   Yes [provider]  gabapentin (NEURONTIN) 600 MG tablet Take 600 mg by mouth 3 (three) times daily. One tablet in the morning and afternoon and 2 tablets every night   Yes [provider]  levETIRAcetam (KEPPRA) 750 MG tablet 1 tablet in the morning, 2 in the evening 08/04/17  Yes [name], MD  levothyroxine (SYNTHROID, LEVOTHROID) 88 MCG tablet Take 88 mcg by mouth daily before breakfast.   Yes  [provider]  metFORMIN (GLUCOPHAGE-XR) 500 MG 24 hr tablet Take 1,000 mg by mouth 2 (two) times daily.   Yes [provider]  methotrexate (RHEUMATREX) 2.5 MG tablet Take 2.5 mg by mouth once a week. Caution:Chemotherapy. Protect from light. Takes 7 tablets every Monday night only   Yes [provider]  olmesartan-hydrochlorothiazide (BENICAR HCT) 40-25 MG per tablet Take 0.5 tablets by mouth daily.    Yes [provider]  oxyCODONE-acetaminophen (PERCOCET) 10-325 MG tablet Take 0.5 tablets by mouth 3 (three) times daily.    Yes [provider]  pantoprazole (PROTONIX) 40 MG tablet Take 40 mg by  mouth 2 (two) times daily.   Yes [provider]  TRULICITY 1.5 GB/1.5VV SOPN INJECT 1.5 SUBCUTANEOUSLY ONCE A WEEK. 01/28/17  Yes [name], [name], MD    ROS:  Out of a complete 14 system review of symptoms, the patient complains only of the following symptoms, and all other reviewed systems are negative.  Excessive thirst Restless legs  Blood pressure (!) 141/78, pulse (!) 106, height 5\' 3"  (1.6 m), weight 165 lb 8 oz (75.1 kg), SpO2 96 %.  Physical Exam  General: The patient is alert and cooperative at the time of the examination.  The patient is moderately obese.  Skin: No significant peripheral edema is noted.   Neurologic Exam  Mental status: The patient is alert and oriented x 3 at the time of the examination. The patient has apparent normal recent and remote memory, with an apparently normal attention span and concentration ability.   Cranial nerves: Facial symmetry is present. Speech is normal, no aphasia or dysarthria is noted. Extraocular movements are full. Visual fields are full.  Motor: The patient has good strength in all 4 extremities.  Sensory examination: Soft touch sensation is symmetric on the face, arms, and legs.  Coordination: The patient has good finger-nose-finger and heel-to-shin bilaterally.  Gait and station: The patient has a normal gait. Tandem gait is normal. Romberg is negative. No drift is seen.  Reflexes: Deep tendon reflexes are symmetric.   Assessment/Plan:  1.  Left T5 or T6 diabetic radiculitis  2.  Diabetic peripheral neuropathy  The patient will continue on her current dose of Keppra, she will follow-up in about 5 months.  Hopefully over time the radiculitis syndrome will gradually improve.  The patient reports neuropathy symptoms on the arms and legs, she has numbness of the hands.  Amitriptyline has been beneficial for her.   Jill Alexanders MD 09/17/2017 8:26 AM  [facility] [address]  [place], [place] [address]  Phone [phone] Fax [phone]

## 2017-09-23 DIAGNOSIS — E039 Hypothyroidism, unspecified: Secondary | ICD-10-CM | POA: Diagnosis not present

## 2017-09-23 DIAGNOSIS — R0781 Pleurodynia: Secondary | ICD-10-CM | POA: Diagnosis not present

## 2017-09-23 DIAGNOSIS — Z6830 Body mass index (BMI) 30.0-30.9, adult: Secondary | ICD-10-CM | POA: Diagnosis not present

## 2017-09-23 DIAGNOSIS — I8290 Acute embolism and thrombosis of unspecified vein: Secondary | ICD-10-CM | POA: Diagnosis not present

## 2017-09-23 DIAGNOSIS — M5414 Radiculopathy, thoracic region: Secondary | ICD-10-CM | POA: Diagnosis not present

## 2017-10-03 ENCOUNTER — Telehealth: Payer: Self-pay | Admitting: Neurology

## 2017-10-03 MED ORDER — GABAPENTIN 800 MG PO TABS: 800.0000 mg | ORAL_TABLET | Freq: Three times a day (TID) | ORAL | 1 refills | Status: DC

## 2017-10-03 NOTE — Telephone Encounter (Signed)
Pt called stating her PCP is taking her off of her fentanyl patches and her pain is at a constant level 9. Requesting a call to discuss increasing her dosing for Keppra

## 2017-10-03 NOTE — Telephone Encounter (Signed)
I called the patient.  The fentanyl patch has been reduced to 12.5 mg, the patient is having some increased pain, we will go up on the gabapentin taking 800 mg 3 times daily.  A prescription was sent in.

## 2017-10-06 DIAGNOSIS — Z79899 Other long term (current) drug therapy: Secondary | ICD-10-CM | POA: Diagnosis not present

## 2017-10-06 DIAGNOSIS — M797 Fibromyalgia: Secondary | ICD-10-CM | POA: Diagnosis not present

## 2017-10-06 DIAGNOSIS — M7552 Bursitis of left shoulder: Secondary | ICD-10-CM | POA: Diagnosis not present

## 2017-10-06 DIAGNOSIS — E1140 Type 2 diabetes mellitus with diabetic neuropathy, unspecified: Secondary | ICD-10-CM | POA: Diagnosis not present

## 2017-10-06 DIAGNOSIS — M1990 Unspecified osteoarthritis, unspecified site: Secondary | ICD-10-CM | POA: Diagnosis not present

## 2017-10-06 DIAGNOSIS — M0579 Rheumatoid arthritis with rheumatoid factor of multiple sites without organ or systems involvement: Secondary | ICD-10-CM | POA: Diagnosis not present

## 2017-10-21 DIAGNOSIS — M797 Fibromyalgia: Secondary | ICD-10-CM | POA: Diagnosis not present

## 2017-10-21 DIAGNOSIS — M5414 Radiculopathy, thoracic region: Secondary | ICD-10-CM | POA: Diagnosis not present

## 2017-10-21 DIAGNOSIS — Z6831 Body mass index (BMI) 31.0-31.9, adult: Secondary | ICD-10-CM | POA: Diagnosis not present

## 2017-10-21 DIAGNOSIS — I8290 Acute embolism and thrombosis of unspecified vein: Secondary | ICD-10-CM | POA: Diagnosis not present

## 2017-10-21 DIAGNOSIS — R0781 Pleurodynia: Secondary | ICD-10-CM | POA: Diagnosis not present

## 2017-10-21 DIAGNOSIS — E039 Hypothyroidism, unspecified: Secondary | ICD-10-CM | POA: Diagnosis not present

## 2017-11-04 ENCOUNTER — Other Ambulatory Visit: Payer: Self-pay | Admitting: Neurology

## 2017-11-18 DIAGNOSIS — M797 Fibromyalgia: Secondary | ICD-10-CM | POA: Diagnosis not present

## 2017-11-18 DIAGNOSIS — Z6831 Body mass index (BMI) 31.0-31.9, adult: Secondary | ICD-10-CM | POA: Diagnosis not present

## 2017-11-18 DIAGNOSIS — R0781 Pleurodynia: Secondary | ICD-10-CM | POA: Diagnosis not present

## 2017-11-18 DIAGNOSIS — I8290 Acute embolism and thrombosis of unspecified vein: Secondary | ICD-10-CM | POA: Diagnosis not present

## 2017-11-18 DIAGNOSIS — M5414 Radiculopathy, thoracic region: Secondary | ICD-10-CM | POA: Diagnosis not present

## 2017-11-18 DIAGNOSIS — E039 Hypothyroidism, unspecified: Secondary | ICD-10-CM | POA: Diagnosis not present

## 2017-12-06 ENCOUNTER — Other Ambulatory Visit: Payer: Self-pay | Admitting: Neurology

## 2018-01-01 DIAGNOSIS — M7551 Bursitis of right shoulder: Secondary | ICD-10-CM | POA: Diagnosis not present

## 2018-01-01 DIAGNOSIS — M797 Fibromyalgia: Secondary | ICD-10-CM | POA: Diagnosis not present

## 2018-01-01 DIAGNOSIS — Z6832 Body mass index (BMI) 32.0-32.9, adult: Secondary | ICD-10-CM | POA: Diagnosis not present

## 2018-01-01 DIAGNOSIS — M0579 Rheumatoid arthritis with rheumatoid factor of multiple sites without organ or systems involvement: Secondary | ICD-10-CM | POA: Diagnosis not present

## 2018-01-31 ENCOUNTER — Other Ambulatory Visit: Payer: Self-pay | Admitting: Neurology

## 2018-02-02 DIAGNOSIS — Z6833 Body mass index (BMI) 33.0-33.9, adult: Secondary | ICD-10-CM | POA: Diagnosis not present

## 2018-02-02 DIAGNOSIS — Z23 Encounter for immunization: Secondary | ICD-10-CM | POA: Diagnosis not present

## 2018-02-02 DIAGNOSIS — S20211A Contusion of right front wall of thorax, initial encounter: Secondary | ICD-10-CM | POA: Diagnosis not present

## 2018-02-20 DIAGNOSIS — L02211 Cutaneous abscess of abdominal wall: Secondary | ICD-10-CM | POA: Diagnosis not present

## 2018-02-20 DIAGNOSIS — Z6832 Body mass index (BMI) 32.0-32.9, adult: Secondary | ICD-10-CM | POA: Diagnosis not present

## 2018-02-23 DIAGNOSIS — L02211 Cutaneous abscess of abdominal wall: Secondary | ICD-10-CM | POA: Diagnosis not present

## 2018-03-04 DIAGNOSIS — E039 Hypothyroidism, unspecified: Secondary | ICD-10-CM | POA: Diagnosis not present

## 2018-03-04 DIAGNOSIS — E1142 Type 2 diabetes mellitus with diabetic polyneuropathy: Secondary | ICD-10-CM | POA: Diagnosis not present

## 2018-03-04 DIAGNOSIS — M797 Fibromyalgia: Secondary | ICD-10-CM | POA: Diagnosis not present

## 2018-03-04 DIAGNOSIS — Z6832 Body mass index (BMI) 32.0-32.9, adult: Secondary | ICD-10-CM | POA: Diagnosis not present

## 2018-03-04 DIAGNOSIS — L02211 Cutaneous abscess of abdominal wall: Secondary | ICD-10-CM | POA: Diagnosis not present

## 2018-03-04 DIAGNOSIS — E279 Disorder of adrenal gland, unspecified: Secondary | ICD-10-CM | POA: Diagnosis not present

## 2018-03-04 DIAGNOSIS — I8290 Acute embolism and thrombosis of unspecified vein: Secondary | ICD-10-CM | POA: Diagnosis not present

## 2018-03-04 DIAGNOSIS — Z23 Encounter for immunization: Secondary | ICD-10-CM | POA: Diagnosis not present

## 2018-03-09 ENCOUNTER — Other Ambulatory Visit: Payer: Self-pay | Admitting: Neurology

## 2018-03-17 DIAGNOSIS — L720 Epidermal cyst: Secondary | ICD-10-CM | POA: Diagnosis not present

## 2018-03-17 NOTE — Progress Notes (Signed)
[facility]  PATIENT: [name] DOB: 1955-04-05   REASON FOR VISIT: Follow-up for diabetic radiculopathy HISTORY FROM: Patient and husband    HISTORY OF PRESENT ILLNESS:UPDATE 1/22/2020CM [name],-year-old female returns for follow-up with history of diabetes and left T5-T6 radiculitis.  She was placed on Keppra for her pain by Dr. Jannifer Franklin and she feels much improved in the past 6 months.  She is also on amitriptyline Cymbalta and Neurontin.  She has been slowly weaning off of her narcotic medications by her primary care.  She has stopped her fentanyl.  She remains on Percocet but is also weaning this.  She is sleeping well at night.  She returns for reevaluation 7/24/19KWMs. [name] is a 63 year old right-handed white female with a history of diabetes and a left T5 or T6 level radiculitis.  MRI of the thoracic spine did not show a compressive lesion.  The patient is felt to have a diabetic radiculitis.  She did not tolerate Trileptal as this increased burning sensations in her feet associated with her diabetic neuropathy.  The patient recently has been placed on amitriptyline which has been helpful, the use of Keppra has also been helpful.  The patient still has hypersensitivity of the skin in the thoracic dermatome, she occasionally may have lancinating pains.  She is sleeping fairly well at night, she is on a slow taper of oxycodone, currently taking 1/2 tablet 3 times daily.  The patient takes capsicin topical cream for her feet.  She returns for an evaluation.  The patient also is on Cymbalta and gabapentin.   REVIEW OF SYSTEMS: Full 14 system review of systems performed and notable only for those listed, all others are neg:  Constitutional: neg  Cardiovascular: neg Ear/Nose/Throat: neg  Skin: neg Eyes: neg Respiratory: neg Gastroitestinal: neg  Hematology/Lymphatic: neg  Endocrine: neg Musculoskeletal:neg Allergy/Immunology: neg Neurological:  neg Psychiatric: neg Sleep : neg   ALLERGIES: No Known Allergies  HOME MEDICATIONS: Outpatient Medications Prior to Visit  Medication Sig Dispense Refill  . acetaminophen (TYLENOL) 500 MG tablet Take 500 mg by mouth every 6 (six) hours as needed.    Marland Kitchen amitriptyline (ELAVIL) 25 MG tablet Take 25 mg by mouth at bedtime.    Marland Kitchen apixaban (ELIQUIS) 5 MG TABS tablet Take 5 mg by mouth 2 (two) times daily.    . Capsaicin (CAPZASIN EX) Apply 1 application topically as needed (for legs/feet).    Marland Kitchen docusate sodium (COLACE) 100 MG capsule Take 100 mg by mouth daily.    . DULoxetine (CYMBALTA) 60 MG capsule Take 60 mg by mouth every evening.     . ezetimibe (ZETIA) 10 MG tablet TAKE ONE TABLET BY MOUTH AT BEDTIME. 30 tablet 2  . folic acid (FOLVITE) 1 MG tablet Take 1 mg by mouth daily.    Marland Kitchen gabapentin (NEURONTIN) 800 MG tablet Take 1 tablet (800 mg total) by mouth 3 (three) times daily. 270 tablet 1  . levETIRAcetam (KEPPRA) 750 MG tablet TAKE ONE TABLET BY MOUTH EVERY MORNING AND TAKE TWO (2) TABLETS EVERY EVENING. 90 tablet 3  . levothyroxine (SYNTHROID, LEVOTHROID) 88 MCG tablet Take 88 mcg by mouth daily before breakfast.    . lidocaine (LIDODERM) 5 % APPLY TO THE AFFECTED AREA FOR 12 HOURS THEN REMOVE FOR 12 HOURS.    . metFORMIN (GLUCOPHAGE-XR) 500 MG 24 hr tablet Take 1,000 mg by mouth 2 (two) times daily.    . methotrexate (RHEUMATREX) 2.5 MG tablet Take 2.5 mg by mouth  once a week. Caution:Chemotherapy. Protect from light. Takes 7 tablets every Monday night only    . olmesartan-hydrochlorothiazide (BENICAR HCT) 40-25 MG per tablet Take 0.5 tablets by mouth daily.     Marland Kitchen oxyCODONE-acetaminophen (PERCOCET) 10-325 MG tablet Take 0.5 tablets by mouth 3 (three) times daily.     . pantoprazole (PROTONIX) 40 MG tablet Take 40 mg by mouth 2 (two) times daily.    . TRULICITY 1.5 OB/0.9GG SOPN INJECT 1.5 SUBCUTANEOUSLY ONCE A WEEK. 2 mL 2   No facility-administered medications prior to visit.      PAST MEDICAL HISTORY: Past Medical History:  Diagnosis Date  . Bradycardia   . Diabetic radiculopathy ([place]) 04/30/2017  . High blood pressure   . High cholesterol     PAST SURGICAL HISTORY: Past Surgical History:  Procedure Laterality Date  . ABDOMINAL HYSTERECTOMY    . APPENDECTOMY    . hemorhoidectomy    . IVC FILTER INSERTION N/A 09/12/2016   Procedure: IVC Filter Insertion;  Surgeon: Conrad Wellman, MD;  Location: [facility];  Service: Cardiovascular;  Laterality: N/A;  . NECK SURGERY    . REFRACTIVE SURGERY    . TONSILLECTOMY      FAMILY HISTORY: Family History  Problem Relation Age of Onset  . Hypertension Mother   . Cancer Mother   . Heart Problems Mother   . Heart Problems Father   . Diabetes Maternal Grandfather     SOCIAL HISTORY: Social History   Socioeconomic History  . Marital status: Married    Spouse name: Not on file  . Number of children: 2  . Years of education: 80  . Highest education level: Not on file  Occupational History  . Occupation: Unemployed- disabled  Social Needs  . Financial resource strain: Not on file  . Food insecurity:    Worry: Not on file    Inability: Not on file  . Transportation needs:    Medical: Not on file    Non-medical: Not on file  Tobacco Use  . Smoking status: Former Research scientist (life sciences)  . Smokeless tobacco: Never Used  Substance and Sexual Activity  . Alcohol use: No    Alcohol/week: 0.0 standard drinks  . Drug use: No  . Sexual activity: Not on file  Lifestyle  . Physical activity:    Days per week: Not on file    Minutes per session: Not on file  . Stress: Not on file  Relationships  . Social connections:    Talks on phone: Not on file    Gets together: Not on file    Attends religious service: Not on file    Active member of club or organization: Not on file    Attends meetings of clubs or organizations: Not on file    Relationship status: Not on file  . Intimate partner violence:    Fear of  current or ex partner: Not on file    Emotionally abused: Not on file    Physically abused: Not on file    Forced sexual activity: Not on file  Other Topics Concern  . Not on file  Social History Narrative   Lives with spouse   Caffeine use: Coffee every morning (1 cup)   Right handed      PHYSICAL EXAM  Vitals:   03/18/18 0805  BP: 134/86  Pulse: 84  Weight: 186 lb (84.4 kg)  Height: 5' 3" (1.6 m)   Body mass index is 32.95 kg/m.  Generalized: Well  developed, in no acute distress  Neurological examination   Mentation: Alert oriented to time, place, history taking. Attention span and concentration appropriate. Recent and remote memory intact.  Follows all commands speech and language fluent.   Cranial nerve II-XII: Pupils were equal round reactive to light extraocular movements were full, visual field were full on confrontational test. Facial sensation and strength were normal. hearing was intact to finger rubbing bilaterally. Uvula tongue midline. head turning and shoulder shrug were normal and symmetric.Tongue protrusion into cheek strength was normal. Motor: normal bulk and tone, full strength in the BUE, BLE,  Sensory: normal and symmetric to light touch, pinprick, and  Vibration in the upper and lower extremities  Coordination: finger-nose-finger, heel-to-shin bilaterally, no dysmetria Reflexes: Symmetric upper and lower plantar responses were flexor bilaterally. Gait and Station: Rising up from seated position without assistance, normal stance,  moderate stride, good arm swing, smooth turning, able to perform tiptoe, and heel walking without difficulty. Tandem gait is steady.  Romberg negative  DIAGNOSTIC DATA (LABS, IMAGING, TESTING) - I reviewed patient records, labs, notes, testing and imaging myself where available.  Lab Results  Component Value Date   WBC 13.7 (H) 06/12/2009   HGB 10.2 (L) 09/12/2016   HCT 30.0 (L) 09/12/2016   MCV 100.6 (H) 06/12/2009   PLT  286 06/12/2009      Component Value Date/Time   NA 137 09/12/2016 0618   K 4.1 09/12/2016 0618   CL 101 09/12/2016 0618   CO2 23 06/12/2009 1155   GLUCOSE 259 (H) 09/12/2016 0618   BUN 8 09/12/2016 0618   CREATININE 0.80 09/12/2016 0618   CALCIUM 9.7 06/12/2009 1155   PROT 6.4 01/20/2008 2225   ALBUMIN 2.9 (L) 01/20/2008 2225   AST 15 01/20/2008 2225   ALT 22 01/20/2008 2225   ALKPHOS 66 01/20/2008 2225   BILITOT 0.7 01/20/2008 2225   GFRNONAA 60 (L) 06/12/2009 1155   GFRAA  06/12/2009 1155    >60        The eGFR has been calculated using the MDRD equation. This calculation has not been validated in all clinical situations. eGFR's persistently <60 mL/min signify possible Chronic Kidney Disease.   Lab Results  Component Value Date   CHOL  01/21/2008    174        ATP III CLASSIFICATION:  <200     mg/dL   Desirable  200-239  mg/dL   Borderline High  >=240    mg/dL   High   HDL 28 (L) 01/21/2008   LDLCALC (H) 01/21/2008    112        Total Cholesterol/HDL:CHD Risk Coronary Heart Disease Risk Table                     Men   Women  1/2 Average Risk   3.4   3.3   TRIG 168 (H) 01/21/2008   CHOLHDL 6.2 01/21/2008   Lab Results  Component Value Date   HGBA1C 9.8 06/14/2015   Lab Results  Component Value Date   VITAMINB12 418 01/20/2008   Lab Results  Component Value Date   TSH 1.632 Test methodology is 3rd generation TSH 01/21/2008      ASSESSMENT AND PLAN  63 y.o. year old female  has a past medical history of Bradycardia, Diabetic radiculopathy ([place]) (04/30/2017), High blood pressure, and High cholesterol. here to follow-up for 1. Left T5 or T6 diabetic radiculitis 2.  Diabetic peripheral neuropathy  PLAN:  Continue Keppra at current dose does not need refills Also continue amitriptyline Cymbalta and Neurontin Call for worsening of symptoms F/U 6 to 8 months Dennie Bible, [facility], [facility], [facility] [address] [place], [place] [address] [phone]

## 2018-03-18 ENCOUNTER — Encounter: Payer: Self-pay | Admitting: Nurse Practitioner

## 2018-03-18 ENCOUNTER — Ambulatory Visit (INDEPENDENT_AMBULATORY_CARE_PROVIDER_SITE_OTHER): Payer: Medicare Other | Admitting: Nurse Practitioner

## 2018-03-18 VITALS — BP 134/86 | HR 84 | Ht 63.0 in | Wt 186.0 lb

## 2018-03-18 DIAGNOSIS — E1149 Type 2 diabetes mellitus with other diabetic neurological complication: Secondary | ICD-10-CM | POA: Diagnosis not present

## 2018-03-18 DIAGNOSIS — M5410 Radiculopathy, site unspecified: Secondary | ICD-10-CM | POA: Diagnosis not present

## 2018-03-18 NOTE — Patient Instructions (Signed)
Continue Keppra at current dose does not need refills Also continue amitriptyline Cymbalta and Neurontin F/U 6 to 8 months

## 2018-03-18 NOTE — Progress Notes (Signed)
I have read the note, and I agree with the clinical assessment and plan.  Dayven Linsley K Lacretia Tindall   

## 2018-04-08 DIAGNOSIS — Z79899 Other long term (current) drug therapy: Secondary | ICD-10-CM | POA: Diagnosis not present

## 2018-04-08 DIAGNOSIS — M25511 Pain in right shoulder: Secondary | ICD-10-CM | POA: Diagnosis not present

## 2018-04-08 DIAGNOSIS — M0579 Rheumatoid arthritis with rheumatoid factor of multiple sites without organ or systems involvement: Secondary | ICD-10-CM | POA: Diagnosis not present

## 2018-04-08 DIAGNOSIS — M1990 Unspecified osteoarthritis, unspecified site: Secondary | ICD-10-CM | POA: Diagnosis not present

## 2018-04-08 DIAGNOSIS — E1140 Type 2 diabetes mellitus with diabetic neuropathy, unspecified: Secondary | ICD-10-CM | POA: Diagnosis not present

## 2018-04-08 DIAGNOSIS — E669 Obesity, unspecified: Secondary | ICD-10-CM | POA: Diagnosis not present

## 2018-04-08 DIAGNOSIS — M797 Fibromyalgia: Secondary | ICD-10-CM | POA: Diagnosis not present

## 2018-04-30 ENCOUNTER — Other Ambulatory Visit: Payer: Self-pay | Admitting: Neurology

## 2018-05-21 DIAGNOSIS — E782 Mixed hyperlipidemia: Secondary | ICD-10-CM | POA: Diagnosis not present

## 2018-05-21 DIAGNOSIS — I2782 Chronic pulmonary embolism: Secondary | ICD-10-CM | POA: Diagnosis not present

## 2018-05-21 DIAGNOSIS — D649 Anemia, unspecified: Secondary | ICD-10-CM | POA: Diagnosis not present

## 2018-05-21 DIAGNOSIS — E1142 Type 2 diabetes mellitus with diabetic polyneuropathy: Secondary | ICD-10-CM | POA: Diagnosis not present

## 2018-05-21 DIAGNOSIS — E039 Hypothyroidism, unspecified: Secondary | ICD-10-CM | POA: Diagnosis not present

## 2018-05-21 DIAGNOSIS — E559 Vitamin D deficiency, unspecified: Secondary | ICD-10-CM | POA: Diagnosis not present

## 2018-05-21 DIAGNOSIS — M0579 Rheumatoid arthritis with rheumatoid factor of multiple sites without organ or systems involvement: Secondary | ICD-10-CM | POA: Diagnosis not present

## 2018-05-21 DIAGNOSIS — E1165 Type 2 diabetes mellitus with hyperglycemia: Secondary | ICD-10-CM | POA: Diagnosis not present

## 2018-05-27 DIAGNOSIS — Z6833 Body mass index (BMI) 33.0-33.9, adult: Secondary | ICD-10-CM | POA: Diagnosis not present

## 2018-05-27 DIAGNOSIS — M0579 Rheumatoid arthritis with rheumatoid factor of multiple sites without organ or systems involvement: Secondary | ICD-10-CM | POA: Diagnosis not present

## 2018-05-27 DIAGNOSIS — E039 Hypothyroidism, unspecified: Secondary | ICD-10-CM | POA: Diagnosis not present

## 2018-05-27 DIAGNOSIS — E1165 Type 2 diabetes mellitus with hyperglycemia: Secondary | ICD-10-CM | POA: Diagnosis not present

## 2018-05-27 DIAGNOSIS — E1142 Type 2 diabetes mellitus with diabetic polyneuropathy: Secondary | ICD-10-CM | POA: Diagnosis not present

## 2018-05-27 DIAGNOSIS — D509 Iron deficiency anemia, unspecified: Secondary | ICD-10-CM | POA: Diagnosis not present

## 2018-05-27 DIAGNOSIS — I8290 Acute embolism and thrombosis of unspecified vein: Secondary | ICD-10-CM | POA: Diagnosis not present

## 2018-05-27 DIAGNOSIS — M549 Dorsalgia, unspecified: Secondary | ICD-10-CM | POA: Diagnosis not present

## 2018-06-29 ENCOUNTER — Other Ambulatory Visit: Payer: Self-pay | Admitting: Neurology

## 2018-07-24 ENCOUNTER — Other Ambulatory Visit: Payer: Self-pay | Admitting: Neurology

## 2018-08-27 DIAGNOSIS — I2782 Chronic pulmonary embolism: Secondary | ICD-10-CM | POA: Diagnosis not present

## 2018-08-27 DIAGNOSIS — E559 Vitamin D deficiency, unspecified: Secondary | ICD-10-CM | POA: Diagnosis not present

## 2018-08-27 DIAGNOSIS — E785 Hyperlipidemia, unspecified: Secondary | ICD-10-CM | POA: Diagnosis not present

## 2018-08-27 DIAGNOSIS — M0579 Rheumatoid arthritis with rheumatoid factor of multiple sites without organ or systems involvement: Secondary | ICD-10-CM | POA: Diagnosis not present

## 2018-08-27 DIAGNOSIS — D519 Vitamin B12 deficiency anemia, unspecified: Secondary | ICD-10-CM | POA: Diagnosis not present

## 2018-08-27 DIAGNOSIS — E1142 Type 2 diabetes mellitus with diabetic polyneuropathy: Secondary | ICD-10-CM | POA: Diagnosis not present

## 2018-08-27 DIAGNOSIS — E782 Mixed hyperlipidemia: Secondary | ICD-10-CM | POA: Diagnosis not present

## 2018-08-27 DIAGNOSIS — E038 Other specified hypothyroidism: Secondary | ICD-10-CM | POA: Diagnosis not present

## 2018-08-27 DIAGNOSIS — E039 Hypothyroidism, unspecified: Secondary | ICD-10-CM | POA: Diagnosis not present

## 2018-08-27 DIAGNOSIS — E1165 Type 2 diabetes mellitus with hyperglycemia: Secondary | ICD-10-CM | POA: Diagnosis not present

## 2018-09-03 DIAGNOSIS — D519 Vitamin B12 deficiency anemia, unspecified: Secondary | ICD-10-CM | POA: Diagnosis not present

## 2018-09-03 DIAGNOSIS — Z6834 Body mass index (BMI) 34.0-34.9, adult: Secondary | ICD-10-CM | POA: Diagnosis not present

## 2018-09-03 DIAGNOSIS — I82409 Acute embolism and thrombosis of unspecified deep veins of unspecified lower extremity: Secondary | ICD-10-CM | POA: Diagnosis not present

## 2018-09-03 DIAGNOSIS — E279 Disorder of adrenal gland, unspecified: Secondary | ICD-10-CM | POA: Diagnosis not present

## 2018-09-08 DIAGNOSIS — E669 Obesity, unspecified: Secondary | ICD-10-CM | POA: Diagnosis not present

## 2018-09-08 DIAGNOSIS — M0579 Rheumatoid arthritis with rheumatoid factor of multiple sites without organ or systems involvement: Secondary | ICD-10-CM | POA: Diagnosis not present

## 2018-09-08 DIAGNOSIS — M1990 Unspecified osteoarthritis, unspecified site: Secondary | ICD-10-CM | POA: Diagnosis not present

## 2018-09-08 DIAGNOSIS — Z79899 Other long term (current) drug therapy: Secondary | ICD-10-CM | POA: Diagnosis not present

## 2018-09-08 DIAGNOSIS — M797 Fibromyalgia: Secondary | ICD-10-CM | POA: Diagnosis not present

## 2018-09-08 DIAGNOSIS — E1140 Type 2 diabetes mellitus with diabetic neuropathy, unspecified: Secondary | ICD-10-CM | POA: Diagnosis not present

## 2018-10-02 ENCOUNTER — Other Ambulatory Visit (HOSPITAL_COMMUNITY): Payer: Self-pay | Admitting: General Surgery

## 2018-10-02 DIAGNOSIS — E278 Other specified disorders of adrenal gland: Secondary | ICD-10-CM | POA: Diagnosis not present

## 2018-10-16 ENCOUNTER — Other Ambulatory Visit: Payer: Self-pay | Admitting: General Surgery

## 2018-10-16 ENCOUNTER — Other Ambulatory Visit (HOSPITAL_COMMUNITY): Payer: Self-pay | Admitting: General Surgery

## 2018-10-16 DIAGNOSIS — E278 Other specified disorders of adrenal gland: Secondary | ICD-10-CM

## 2018-10-18 NOTE — Progress Notes (Signed)
° ° °PATIENT: [name] °DOB: 04/29/1955 ° °REASON FOR VISIT: follow up °HISTORY FROM: patient ° °HISTORY OF PRESENT ILLNESS: °Today 10/19/18  °[name] is a 63-year-old female with history of diabetes and left T5-T6 radiculitis.  She remains on Keppra, amitriptyline, Cymbalta, and gabapentin.  These medications provide adequate pain control.  She is no longer taking oxycodone or fentanyl patch.  She may take an occasional Tylenol or lidocaine patch for pain.  She had recent lab work done by her primary 08/28/2018, showed a low B12 level 201.  She received injections, is now on oral supplementation.  Her recent A1c was 7.3.  She denies any issues with her walking or balance.  She has not had any falls.  Denies any new problems or concerns.  She is to be having a CT scan in the near future for possible tumors on her adrenal gland.  She presents today for follow-up accompanied by her husband. ° °HISTORY °1/22/2020CM [name],-year-old female returns for follow-up with history of diabetes and left T5-T6 radiculitis.  She was placed on Keppra for her pain by Dr. Willis and she feels much improved in the past 6 months.  She is also on amitriptyline Cymbalta and Neurontin. She has been slowly weaning off of her narcotic medications by her primary care.  She has stopped her fentanyl.  She remains on Percocet but is also weaning this.  She is sleeping well at night.  She returns for reevaluation ° °REVIEW OF SYSTEMS: Out of a complete 14 system review of symptoms, the patient complains only of the following symptoms, and all other reviewed systems are negative. ° °Restless leg, dizziness, excessive thirst ° °ALLERGIES: °No Known Allergies ° °HOME MEDICATIONS: °Outpatient Medications Prior to Visit  °Medication Sig Dispense Refill  °• acetaminophen (TYLENOL) 500 MG tablet Take 500 mg by mouth as needed.     °• amitriptyline (ELAVIL) 25 MG tablet Take 25 mg by mouth at bedtime.    °• apixaban (ELIQUIS) 5 MG  TABS tablet Take 5 mg by mouth 2 (two) times daily.    °• Capsaicin (CAPZASIN EX) Apply 1 application topically as needed (for legs/feet).    °• docusate sodium (COLACE) 100 MG capsule Take 100 mg by mouth daily.    °• DULoxetine (CYMBALTA) 60 MG capsule Take 60 mg by mouth every evening.     °• ezetimibe (ZETIA) 10 MG tablet TAKE ONE TABLET BY MOUTH AT BEDTIME. 30 tablet 2  °• folic acid (FOLVITE) 1 MG tablet Take 1 mg by mouth daily.    °• gabapentin (NEURONTIN) 800 MG tablet TAKE ONE TABLET BY MOUTH THREE TIMES DAILY. 270 tablet 1  °• levothyroxine (SYNTHROID, LEVOTHROID) 88 MCG tablet Take 88 mcg by mouth daily before breakfast.    °• lidocaine (LIDODERM) 5 % APPLY TO THE AFFECTED AREA FOR 12 HOURS THEN REMOVE FOR 12 HOURS.    °• metFORMIN (GLUCOPHAGE-XR) 500 MG 24 hr tablet Take 1,000 mg by mouth 2 (two) times daily.    °• methotrexate (RHEUMATREX) 2.5 MG tablet Take 2.5 mg by mouth once a week. Caution:Chemotherapy. Protect from light. °Takes 7 tablets every Monday night only    °• olmesartan-hydrochlorothiazide (BENICAR HCT) 40-25 MG per tablet Take 0.5 tablets by mouth daily.     °• pantoprazole (PROTONIX) 40 MG tablet Take 40 mg by mouth 2 (two) times daily.    °• TRULICITY 1.5 MG/0.5ML SOPN INJECT 1.5 SUBCUTANEOUSLY ONCE A WEEK. 2 mL 2  °• vitamin B-12 (  B-12 (CYANOCOBALAMIN) 1000 MCG tablet Take 1,000 mcg by mouth daily.     levETIRAcetam (KEPPRA) 750 MG tablet TAKE ONE TABLET BY MOUTH EVERY MORNING AND TAKE TWO (2) TABLETS EVERY EVENING. 90 tablet 3   oxyCODONE-acetaminophen (PERCOCET) 10-325 MG tablet Take 0.5 tablets by mouth 3 (three) times daily.      No facility-administered medications prior to visit.     PAST MEDICAL HISTORY: Past Medical History:  Diagnosis Date   Bradycardia    Diabetic radiculopathy ([place]) 04/30/2017   High blood pressure    High cholesterol     PAST SURGICAL HISTORY: Past Surgical History:  Procedure Laterality Date   ABDOMINAL HYSTERECTOMY     APPENDECTOMY      hemorhoidectomy     IVC FILTER INSERTION N/A 09/12/2016   Procedure: IVC Filter Insertion;  Surgeon: Conrad Eagle [place]te, MD;  Location: [facility];  Service: Cardiovascular;  Laterality: N/A;   NECK SURGERY     REFRACTIVE SURGERY     TONSILLECTOMY      FAMILY HISTORY: Family History  Problem Relation Age of Onset   Hypertension Mother    Cancer Mother    Heart Problems Mother    Heart Problems Father    Diabetes Maternal Grandfather     SOCIAL HISTORY: Social History   Socioeconomic History   Marital status: Married    Spouse name: Not on file   Number of children: 2   Years of education: 12   Highest education level: Not on file  Occupational History   Occupation: Unemployed- disabled  Scientist, product/process development strain: Not on file   Food insecurity    Worry: Not on file    Inability: Not on file   Transportation needs    Medical: Not on file    Non-medical: Not on file  Tobacco Use   Smoking status: Former Smoker   Smokeless tobacco: Never Used  Substance and Sexual Activity   Alcohol use: No    Alcohol/week: 0.0 standard drinks   Drug use: No   Sexual activity: Not on file  Lifestyle   Physical activity    Days per week: Not on file    Minutes per session: Not on file   Stress: Not on file  Relationships   Social connections    Talks on phone: Not on file    Gets together: Not on file    Attends religious service: Not on file    Active member of club or organization: Not on file    Attends meetings of clubs or organizations: Not on file    Relationship status: Not on file   Intimate partner violence    Fear of current or ex partner: Not on file    Emotionally abused: Not on file    Physically abused: Not on file    Forced sexual activity: Not on file  Other Topics Concern   Not on file  Social History Narrative   Lives with spouse   Caffeine use: Coffee every morning (1 cup)   Right handed     PHYSICAL  EXAM  Vitals:   10/19/18 0831  BP: (!) 144/84  Pulse: (!) 103  Temp: 98 F (36.7 C)  TempSrc: Oral  Weight: 194 lb 9.6 oz (88.3 kg)  Height: 5' 3" (1.6 m)   Body mass index is 34.47 kg/m.  Generalized: Well developed, in no acute distress   Neurological examination  Mentation: Alert oriented to time, place, history taking.  all commands speech and language fluent °Cranial nerve II-XII: Pupils were equal round reactive to light. Extraocular movements were full, visual field were full on confrontational test. Facial sensation and strength were normal. Head turning and shoulder shrug  were normal and symmetric. °Motor: The motor testing reveals 5 over 5 strength of all 4 extremities. Good symmetric motor tone is noted throughout.  °Sensory: Sensory testing is intact to soft touch on all 4 extremities. No evidence of extinction is noted.  °Coordination: Cerebellar testing reveals good finger-nose-finger and heel-to-shin bilaterally.  °Gait and station: Gait is normal. Tandem gait is normal. Romberg is mildly positive.  °Reflexes: Deep tendon reflexes are symmetric and normal bilaterally.  ° °DIAGNOSTIC DATA (LABS, IMAGING, TESTING) °- I reviewed patient records, labs, notes, testing and imaging myself where available. ° °Lab Results  °Component Value Date  ° WBC 13.7 (H) 06/12/2009  ° HGB 10.2 (L) 09/12/2016  ° HCT 30.0 (L) 09/12/2016  ° MCV 100.6 (H) 06/12/2009  ° PLT 286 06/12/2009  ° °   °Component Value Date/Time  ° NA 137 09/12/2016 0618  ° K 4.1 09/12/2016 0618  ° CL 101 09/12/2016 0618  ° CO2 23 06/12/2009 1155  ° GLUCOSE 259 (H) 09/12/2016 0618  ° BUN 8 09/12/2016 0618  ° CREATININE 0.80 09/12/2016 0618  ° CALCIUM 9.7 06/12/2009 1155  ° PROT 6.4 01/20/2008 2225  ° ALBUMIN 2.9 (L) 01/20/2008 2225  ° AST 15 01/20/2008 2225  ° ALT 22 01/20/2008 2225  ° ALKPHOS 66 01/20/2008 2225  ° BILITOT 0.7 01/20/2008 2225  ° GFRNONAA 60 (L) 06/12/2009 1155  ° GFRAA  06/12/2009 1155  °  >60        °The  eGFR has been calculated °using the MDRD equation. °This calculation has not been °validated in all clinical °situations. °eGFR's persistently °<60 mL/min signify °possible Chronic Kidney Disease.  ° °Lab Results  °Component Value Date  ° CHOL  01/21/2008  °  174        °ATP III CLASSIFICATION: ° <200     mg/dL   Desirable ° 200-239  mg/dL   Borderline High ° >=240    mg/dL   High  ° HDL 28 (L) 01/21/2008  ° LDLCALC (H) 01/21/2008  °  112        °Total Cholesterol/HDL:CHD Risk °Coronary Heart Disease Risk Table °                    Men   Women ° 1/2 Average Risk   3.4   3.3  ° TRIG 168 (H) 01/21/2008  ° CHOLHDL 6.2 01/21/2008  ° °Lab Results  °Component Value Date  ° HGBA1C 9.8 06/14/2015  ° °Lab Results  °Component Value Date  ° VITAMINB12 418 01/20/2008  ° ° °ASSESSMENT AND PLAN °63 y.o. year old female  has a past medical history of Bradycardia, Diabetic radiculopathy ([facility]) (04/30/2017), High blood pressure, and High cholesterol. here with: ° °1.  Left T5-T6 diabetic radiculitis °2.  Diabetic peripheral neuropathy ° °Her pain and symptoms are adequately controlled with her current regimen of medications including gabapentin, Keppra, Cymbalta, amitriptyline.  We will continue to prescribe Keppra and gabapentin.  I will refill Keppra today.  Her diabetes continues to be under fair control, recent A1c was 7.3.  She was recently found to have a low B12 level, received injections, is now on oral supplementation.  She will continue follow-up with her primary doctor.  She will follow-up at this office in   1 year or sooner if needed.  I advised if her symptoms worsen or she develops any new symptoms should let us know. ° °I spent 15 minutes with the patient. 50% of this time was spent discussing her plan of care. ° ° °Sarah Slack, AGNP-C, DNP 10/19/2018, 9:11 AM °[facility] °[address], [address] °[place], [place] [address] °[phone] ° ° °

## 2018-10-19 ENCOUNTER — Ambulatory Visit (INDEPENDENT_AMBULATORY_CARE_PROVIDER_SITE_OTHER): Payer: Medicare Other | Admitting: Neurology

## 2018-10-19 ENCOUNTER — Other Ambulatory Visit: Payer: Self-pay

## 2018-10-19 ENCOUNTER — Encounter: Payer: Self-pay | Admitting: Neurology

## 2018-10-19 VITALS — BP 144/84 | HR 103 | Temp 98.0°F | Ht 63.0 in | Wt 194.6 lb

## 2018-10-19 DIAGNOSIS — M5410 Radiculopathy, site unspecified: Secondary | ICD-10-CM

## 2018-10-19 DIAGNOSIS — E1149 Type 2 diabetes mellitus with other diabetic neurological complication: Secondary | ICD-10-CM | POA: Diagnosis not present

## 2018-10-19 MED ORDER — LEVETIRACETAM 750 MG PO TABS: ORAL_TABLET | ORAL | 3 refills | Status: DC

## 2018-10-19 NOTE — Progress Notes (Signed)
I have read the note, and I agree with the clinical assessment and plan.  [name]   

## 2018-10-22 ENCOUNTER — Ambulatory Visit (HOSPITAL_COMMUNITY)
Admission: RE | Admit: 2018-10-22 | Discharge: 2018-10-22 | Disposition: A | Discharge status: Home / Self Care | Payer: Medicare Other | Source: Ambulatory Visit | Attending: General Surgery | Admitting: General Surgery

## 2018-10-22 ENCOUNTER — Other Ambulatory Visit: Payer: Self-pay

## 2018-10-22 DIAGNOSIS — I700 Atherosclerosis of aorta: Secondary | ICD-10-CM | POA: Diagnosis not present

## 2018-10-22 DIAGNOSIS — D3500 Benign neoplasm of unspecified adrenal gland: Secondary | ICD-10-CM | POA: Diagnosis not present

## 2018-10-22 DIAGNOSIS — E278 Other specified disorders of adrenal gland: Secondary | ICD-10-CM

## 2018-10-22 DIAGNOSIS — K760 Fatty (change of) liver, not elsewhere classified: Secondary | ICD-10-CM | POA: Insufficient documentation

## 2018-10-22 DIAGNOSIS — I517 Cardiomegaly: Secondary | ICD-10-CM | POA: Insufficient documentation

## 2018-10-22 DIAGNOSIS — D3501 Benign neoplasm of right adrenal gland: Secondary | ICD-10-CM | POA: Insufficient documentation

## 2018-10-22 DIAGNOSIS — E279 Disorder of adrenal gland, unspecified: Secondary | ICD-10-CM | POA: Diagnosis not present

## 2018-10-22 LAB — POCT I-STAT CREATININE: Creatinine, Ser: 1 mg/dL (ref 0.44–1.00)

## 2018-10-22 MED ORDER — IOHEXOL 300 MG/ML  SOLN
100.0000 mL | Freq: Once | INTRAMUSCULAR | Status: AC | PRN
  Administered 2018-10-22: 10:00:00 100 mL via INTRAVENOUS

## 2018-12-02 DIAGNOSIS — E782 Mixed hyperlipidemia: Secondary | ICD-10-CM | POA: Diagnosis not present

## 2018-12-02 DIAGNOSIS — R42 Dizziness and giddiness: Secondary | ICD-10-CM | POA: Diagnosis not present

## 2018-12-02 DIAGNOSIS — D509 Iron deficiency anemia, unspecified: Secondary | ICD-10-CM | POA: Diagnosis not present

## 2018-12-02 DIAGNOSIS — E1142 Type 2 diabetes mellitus with diabetic polyneuropathy: Secondary | ICD-10-CM | POA: Diagnosis not present

## 2018-12-02 DIAGNOSIS — E785 Hyperlipidemia, unspecified: Secondary | ICD-10-CM | POA: Diagnosis not present

## 2018-12-17 DIAGNOSIS — E1165 Type 2 diabetes mellitus with hyperglycemia: Secondary | ICD-10-CM | POA: Diagnosis not present

## 2018-12-17 DIAGNOSIS — Z794 Long term (current) use of insulin: Secondary | ICD-10-CM | POA: Diagnosis not present

## 2018-12-17 DIAGNOSIS — Z6835 Body mass index (BMI) 35.0-35.9, adult: Secondary | ICD-10-CM | POA: Diagnosis not present

## 2019-01-25 ENCOUNTER — Other Ambulatory Visit: Payer: Self-pay | Admitting: Neurology

## 2019-01-25 DIAGNOSIS — E785 Hyperlipidemia, unspecified: Secondary | ICD-10-CM | POA: Diagnosis not present

## 2019-01-25 DIAGNOSIS — E1165 Type 2 diabetes mellitus with hyperglycemia: Secondary | ICD-10-CM | POA: Diagnosis not present

## 2019-03-09 DIAGNOSIS — Z0001 Encounter for general adult medical examination with abnormal findings: Secondary | ICD-10-CM | POA: Diagnosis not present

## 2019-03-09 DIAGNOSIS — Z6835 Body mass index (BMI) 35.0-35.9, adult: Secondary | ICD-10-CM | POA: Diagnosis not present

## 2019-03-09 DIAGNOSIS — M0579 Rheumatoid arthritis with rheumatoid factor of multiple sites without organ or systems involvement: Secondary | ICD-10-CM | POA: Diagnosis not present

## 2019-03-09 DIAGNOSIS — E1165 Type 2 diabetes mellitus with hyperglycemia: Secondary | ICD-10-CM | POA: Diagnosis not present

## 2019-03-11 DIAGNOSIS — M1990 Unspecified osteoarthritis, unspecified site: Secondary | ICD-10-CM | POA: Diagnosis not present

## 2019-03-11 DIAGNOSIS — M797 Fibromyalgia: Secondary | ICD-10-CM | POA: Diagnosis not present

## 2019-03-11 DIAGNOSIS — E1140 Type 2 diabetes mellitus with diabetic neuropathy, unspecified: Secondary | ICD-10-CM | POA: Diagnosis not present

## 2019-03-11 DIAGNOSIS — M0579 Rheumatoid arthritis with rheumatoid factor of multiple sites without organ or systems involvement: Secondary | ICD-10-CM | POA: Diagnosis not present

## 2019-03-11 DIAGNOSIS — E669 Obesity, unspecified: Secondary | ICD-10-CM | POA: Diagnosis not present

## 2019-03-11 DIAGNOSIS — Z79899 Other long term (current) drug therapy: Secondary | ICD-10-CM | POA: Diagnosis not present

## 2019-03-26 DIAGNOSIS — E1142 Type 2 diabetes mellitus with diabetic polyneuropathy: Secondary | ICD-10-CM | POA: Diagnosis not present

## 2019-03-26 DIAGNOSIS — E7849 Other hyperlipidemia: Secondary | ICD-10-CM | POA: Diagnosis not present

## 2019-05-13 DIAGNOSIS — Z23 Encounter for immunization: Secondary | ICD-10-CM | POA: Diagnosis not present

## 2019-06-10 DIAGNOSIS — Z23 Encounter for immunization: Secondary | ICD-10-CM | POA: Diagnosis not present

## 2019-06-23 DIAGNOSIS — M79643 Pain in unspecified hand: Secondary | ICD-10-CM | POA: Diagnosis not present

## 2019-06-23 DIAGNOSIS — M1990 Unspecified osteoarthritis, unspecified site: Secondary | ICD-10-CM | POA: Diagnosis not present

## 2019-06-23 DIAGNOSIS — E669 Obesity, unspecified: Secondary | ICD-10-CM | POA: Diagnosis not present

## 2019-06-23 DIAGNOSIS — M797 Fibromyalgia: Secondary | ICD-10-CM | POA: Diagnosis not present

## 2019-06-23 DIAGNOSIS — E1140 Type 2 diabetes mellitus with diabetic neuropathy, unspecified: Secondary | ICD-10-CM | POA: Diagnosis not present

## 2019-06-23 DIAGNOSIS — M0579 Rheumatoid arthritis with rheumatoid factor of multiple sites without organ or systems involvement: Secondary | ICD-10-CM | POA: Diagnosis not present

## 2019-06-23 DIAGNOSIS — Z79899 Other long term (current) drug therapy: Secondary | ICD-10-CM | POA: Diagnosis not present

## 2019-06-30 DIAGNOSIS — R509 Fever, unspecified: Secondary | ICD-10-CM | POA: Diagnosis not present

## 2019-06-30 DIAGNOSIS — Z20828 Contact with and (suspected) exposure to other viral communicable diseases: Secondary | ICD-10-CM | POA: Diagnosis not present

## 2019-06-30 DIAGNOSIS — J028 Acute pharyngitis due to other specified organisms: Secondary | ICD-10-CM | POA: Diagnosis not present

## 2019-07-22 ENCOUNTER — Other Ambulatory Visit: Payer: Self-pay | Admitting: Neurology

## 2019-09-08 DIAGNOSIS — E1165 Type 2 diabetes mellitus with hyperglycemia: Secondary | ICD-10-CM | POA: Diagnosis not present

## 2019-09-08 DIAGNOSIS — E559 Vitamin D deficiency, unspecified: Secondary | ICD-10-CM | POA: Diagnosis not present

## 2019-09-08 DIAGNOSIS — I2782 Chronic pulmonary embolism: Secondary | ICD-10-CM | POA: Diagnosis not present

## 2019-09-08 DIAGNOSIS — M797 Fibromyalgia: Secondary | ICD-10-CM | POA: Diagnosis not present

## 2019-09-08 DIAGNOSIS — M0579 Rheumatoid arthritis with rheumatoid factor of multiple sites without organ or systems involvement: Secondary | ICD-10-CM | POA: Diagnosis not present

## 2019-09-08 DIAGNOSIS — E039 Hypothyroidism, unspecified: Secondary | ICD-10-CM | POA: Diagnosis not present

## 2019-09-08 DIAGNOSIS — E785 Hyperlipidemia, unspecified: Secondary | ICD-10-CM | POA: Diagnosis not present

## 2019-09-08 DIAGNOSIS — D519 Vitamin B12 deficiency anemia, unspecified: Secondary | ICD-10-CM | POA: Diagnosis not present

## 2019-09-08 DIAGNOSIS — E1142 Type 2 diabetes mellitus with diabetic polyneuropathy: Secondary | ICD-10-CM | POA: Diagnosis not present

## 2019-09-15 DIAGNOSIS — E11610 Type 2 diabetes mellitus with diabetic neuropathic arthropathy: Secondary | ICD-10-CM | POA: Diagnosis not present

## 2019-09-15 DIAGNOSIS — Z6835 Body mass index (BMI) 35.0-35.9, adult: Secondary | ICD-10-CM | POA: Diagnosis not present

## 2019-09-15 DIAGNOSIS — E1165 Type 2 diabetes mellitus with hyperglycemia: Secondary | ICD-10-CM | POA: Diagnosis not present

## 2019-09-15 DIAGNOSIS — M0579 Rheumatoid arthritis with rheumatoid factor of multiple sites without organ or systems involvement: Secondary | ICD-10-CM | POA: Diagnosis not present

## 2019-09-22 DIAGNOSIS — Z79899 Other long term (current) drug therapy: Secondary | ICD-10-CM | POA: Diagnosis not present

## 2019-09-22 DIAGNOSIS — E1140 Type 2 diabetes mellitus with diabetic neuropathy, unspecified: Secondary | ICD-10-CM | POA: Diagnosis not present

## 2019-09-22 DIAGNOSIS — M1990 Unspecified osteoarthritis, unspecified site: Secondary | ICD-10-CM | POA: Diagnosis not present

## 2019-09-22 DIAGNOSIS — E669 Obesity, unspecified: Secondary | ICD-10-CM | POA: Diagnosis not present

## 2019-09-22 DIAGNOSIS — M797 Fibromyalgia: Secondary | ICD-10-CM | POA: Diagnosis not present

## 2019-09-22 DIAGNOSIS — M0579 Rheumatoid arthritis with rheumatoid factor of multiple sites without organ or systems involvement: Secondary | ICD-10-CM | POA: Diagnosis not present

## 2019-09-24 ENCOUNTER — Other Ambulatory Visit: Payer: Self-pay | Admitting: Neurology

## 2019-10-25 ENCOUNTER — Other Ambulatory Visit: Payer: Self-pay

## 2019-10-25 ENCOUNTER — Encounter: Payer: Self-pay | Admitting: Neurology

## 2019-10-25 ENCOUNTER — Ambulatory Visit (INDEPENDENT_AMBULATORY_CARE_PROVIDER_SITE_OTHER): Payer: Medicare Other | Admitting: Neurology

## 2019-10-25 VITALS — BP 153/81 | HR 108 | Ht 63.0 in | Wt 190.6 lb

## 2019-10-25 DIAGNOSIS — E1149 Type 2 diabetes mellitus with other diabetic neurological complication: Secondary | ICD-10-CM

## 2019-10-25 DIAGNOSIS — M5410 Radiculopathy, site unspecified: Secondary | ICD-10-CM | POA: Diagnosis not present

## 2019-10-25 MED ORDER — GABAPENTIN 800 MG PO TABS: 800.0000 mg | ORAL_TABLET | Freq: Three times a day (TID) | ORAL | 1 refills | Status: DC

## 2019-10-25 MED ORDER — LEVETIRACETAM 750 MG PO TABS: ORAL_TABLET | ORAL | 4 refills | Status: DC

## 2019-10-25 NOTE — Progress Notes (Signed)
PATIENT: [name] DOB: 10-19-1955  REASON FOR VISIT: follow up HISTORY FROM: patient  HISTORY OF PRESENT ILLNESS: Today 10/25/19 [name] is a 64 year old female with history of diabetes and left T5-6 radiculitis.  She is on Keppra, amitriptyline, Cymbalta, and gabapentin.  She is no longer on oxycodone or fentanyl patch, or having to use lidocaine patch.  No longer complains of any pain, but not interested in reducing her dose of medications.  Her recent A1c has increased, up to 9.7.  She is on Trulicity, working to better manage the diabetes.  She has peripheral neuropathy.  Is overall doing well.  Presents today accompanied by her husband.  HISTORY  10/19/2018 SS: Ms. Lapre is a 64 year old female with history of diabetes and left T5-T6 radiculitis.  She remains on Keppra, amitriptyline, Cymbalta, and gabapentin.  These medications provide adequate pain control.  She is no longer taking oxycodone or fentanyl patch.  She may take an occasional Tylenol or lidocaine patch for pain.  She had recent lab work done by her primary 08/28/2018, showed a low B12 level 201.  She received injections, is now on oral supplementation.  Her recent A1c was 7.3.  She denies any issues with her walking or balance.  She has not had any falls.  Denies any new problems or concerns.  She is to be having a CT scan in the near future for possible tumors on her adrenal gland.  She presents today for follow-up accompanied by her husband.  REVIEW OF SYSTEMS: Out of a complete 14 system review of symptoms, the patient complains only of the following symptoms, and all other reviewed systems are negative.  N/A  ALLERGIES: No Known Allergies  HOME MEDICATIONS: Outpatient Medications Prior to Visit  Medication Sig Dispense Refill   acetaminophen (TYLENOL) 500 MG tablet Take 500 mg by mouth as needed.      amitriptyline (ELAVIL) 25 MG tablet Take 25 mg by mouth at bedtime.     apixaban (ELIQUIS) 5 MG  TABS tablet Take 5 mg by mouth 2 (two) times daily.     Capsaicin (CAPZASIN EX) Apply 1 application topically as needed (for legs/feet).     docusate sodium (COLACE) 100 MG capsule Take 100 mg by mouth daily.     DULoxetine (CYMBALTA) 60 MG capsule Take 60 mg by mouth every evening.      ezetimibe (ZETIA) 10 MG tablet TAKE ONE TABLET BY MOUTH AT BEDTIME. 30 tablet 2   folic acid (FOLVITE) 1 MG tablet Take 1 mg by mouth daily.     HUMALOG 100 UNIT/ML injection 10 Units 3 (three) times daily before meals.     LANTUS SOLOSTAR 100 UNIT/ML Solostar Pen 40 Units 2 (two) times daily.     levothyroxine (SYNTHROID, LEVOTHROID) 88 MCG tablet Take 88 mcg by mouth daily before breakfast.     lidocaine (LIDODERM) 5 % APPLY TO THE AFFECTED AREA FOR 12 HOURS THEN REMOVE FOR 12 HOURS.     metFORMIN (GLUCOPHAGE-XR) 500 MG 24 hr tablet Take 1,000 mg by mouth 2 (two) times daily.     methotrexate (RHEUMATREX) 2.5 MG tablet Take 2.5 mg by mouth once a week. Caution:Chemotherapy. Protect from light. Takes 7 tablets every Monday night only     olmesartan-hydrochlorothiazide (BENICAR HCT) 40-25 MG per tablet Take 0.5 tablets by mouth daily.      pantoprazole (PROTONIX) 40 MG tablet Take 40 mg by mouth 2 (two) times daily.     TRULICITY  1.5 MG/0.5ML SOPN INJECT 1.5 SUBCUTANEOUSLY ONCE A WEEK. 2 mL 2   vitamin B-12 (CYANOCOBALAMIN) 1000 MCG tablet Take 1,000 mcg by mouth daily.     gabapentin (NEURONTIN) 800 MG tablet TAKE ONE TABLET BY MOUTH THREE TIMES DAILY. 270 tablet 1   levETIRAcetam (KEPPRA) 750 MG tablet TAKE ONE TABLET BY MOUTH EVERY MORNING AND TWO TABLETS EVERY EVENING. 270 tablet 0   No facility-administered medications prior to visit.    PAST MEDICAL HISTORY: Past Medical History:  Diagnosis Date   Bradycardia    Diabetic radiculopathy ([place]) 04/30/2017   High blood pressure    High cholesterol     PAST SURGICAL HISTORY: Past Surgical History:  Procedure Laterality Date    ABDOMINAL HYSTERECTOMY     APPENDECTOMY     hemorhoidectomy     IVC FILTER INSERTION N/A 09/12/2016   Procedure: IVC Filter Insertion;  Surgeon: Conrad Kaneohe, MD;  Location: [facility];  Service: Cardiovascular;  Laterality: N/A;   NECK SURGERY     REFRACTIVE SURGERY     TONSILLECTOMY      FAMILY HISTORY: Family History  Problem Relation Age of Onset   Hypertension Mother    Cancer Mother    Heart Problems Mother    Heart Problems Father    Diabetes Maternal Grandfather     SOCIAL HISTORY: Social History   Socioeconomic History   Marital status: Married    Spouse name: Not on file   Number of children: 2   Years of education: 12   Highest education level: Not on file  Occupational History   Occupation: Unemployed- disabled  Tobacco Use   Smoking status: Former Smoker   Smokeless tobacco: Never Used  Scientific laboratory technician Use: Never used  Substance and Sexual Activity   Alcohol use: No    Alcohol/week: 0.0 standard drinks   Drug use: No   Sexual activity: Not on file  Other Topics Concern   Not on file  Social History Narrative   Lives with spouse   Caffeine use: Coffee every morning (1 cup)   Right handed    Social Determinants of Health   Financial Resource Strain:    Difficulty of Paying Living Expenses: Not on file  Food Insecurity:    Worried About Charity fundraiser in the Last Year: Not on file   [employer] of Food in the Last Year: Not on file  Transportation Needs:    Lack of Transportation (Medical): Not on file   Lack of Transportation (Non-Medical): Not on file  Physical Activity:    Days of Exercise per Week: Not on file   Minutes of Exercise per Session: Not on file  Stress:    Feeling of Stress : Not on file  Social Connections:    Frequency of Communication with Friends and Family: Not on file   Frequency of Social Gatherings with Friends and Family: Not on file   Attends Religious Services: Not on  file   Active Member of Clubs or Organizations: Not on file   Attends Archivist Meetings: Not on file   Marital Status: Not on file  Intimate Partner Violence:    Fear of Current or Ex-Partner: Not on file   Emotionally Abused: Not on file   Physically Abused: Not on file   Sexually Abused: Not on file   PHYSICAL EXAM  Vitals:   10/25/19 0810  BP: (!) 153/81  Pulse: (!) 108  Weight: 190  lb 9.6 oz (86.5 kg)  Height: _0  (1.6 m)   Body mass index is 33.76 kg/m.  Generalized: Well developed, in no acute distress   Neurological examination  Mentation: Alert oriented to time, place, history taking. Follows all commands speech and language fluent Cranial nerve II-XII: Pupils were equal round reactive to light. Extraocular movements were full, visual field were full on confrontational test. Facial sensation and strength were normal.  Head turning and shoulder shrug  were normal and symmetric. Motor: The motor testing reveals 5 over 5 strength of all 4 extremities. Good symmetric motor tone is noted throughout.  Sensory: Sensory testing is intact to soft touch on all 4 extremities. No evidence of extinction is noted.  Coordination: Cerebellar testing reveals good finger-nose-finger and heel-to-shin bilaterally.  Gait and station: Gait is normal.  Reflexes: Deep tendon reflexes are symmetric and normal bilaterally.   DIAGNOSTIC DATA (LABS, IMAGING, TESTING) - I reviewed patient records, labs, notes, testing and imaging myself where available.  Lab Results  Component Value Date   WBC 13.7 (H) 06/12/2009   HGB 10.2 (L) 09/12/2016   HCT 30.0 (L) 09/12/2016   MCV 100.6 (H) 06/12/2009   PLT 286 06/12/2009      Component Value Date/Time   NA 137 09/12/2016 0618   K 4.1 09/12/2016 0618   CL 101 09/12/2016 0618   CO2 23 06/12/2009 1155   GLUCOSE 259 (H) 09/12/2016 0618   BUN 8 09/12/2016 0618   CREATININE 1.00 10/22/2018 1022   CALCIUM 9.7 06/12/2009 1155    PROT 6.4 01/20/2008 2225   ALBUMIN 2.9 (L) 01/20/2008 2225   AST 15 01/20/2008 2225   ALT 22 01/20/2008 2225   ALKPHOS 66 01/20/2008 2225   BILITOT 0.7 01/20/2008 2225   GFRNONAA 60 (L) 06/12/2009 1155   GFRAA  06/12/2009 1155    >60        The eGFR has been calculated using the MDRD equation. This calculation has not been validated in all clinical situations. eGFR's persistently <60 mL/min signify possible Chronic Kidney Disease.   Lab Results  Component Value Date   CHOL  01/21/2008    174        ATP III CLASSIFICATION:  <200     mg/dL   Desirable  200-239  mg/dL   Borderline High  >=240    mg/dL   High   HDL 28 (L) 01/21/2008   LDLCALC (H) 01/21/2008    112        Total Cholesterol/HDL:CHD Risk Coronary Heart Disease Risk Table                     Men   Women  1/2 Average Risk   3.4   3.3   TRIG 168 (H) 01/21/2008   CHOLHDL 6.2 01/21/2008   Lab Results  Component Value Date   HGBA1C 9.8 06/14/2015   Lab Results  Component Value Date   VITAMINB12 418 01/20/2008    ASSESSMENT AND PLAN 64 y.o. year old female  has a past medical history of Bradycardia, Diabetic radiculopathy ([place]) (04/30/2017), High blood pressure, and High cholesterol. here with:  1.  Left T5-6 diabetic radiculitis 2.  Diabetic peripheral neuropathy  -No longer reports pain from the radiculitis -Not interested in dose reduction at this point -Continue gabapentin 800 mg 3 times daily -Continue Keppra 750 mg, 1 in the morning, 2 tablets in the evening -Continue routine follow-up with PCP, for better management of diabetes -Follow-up in  1 year or sooner if needed  I spent 20 minutes of face-to-face and non-face-to-face time with patient.  This included previsit chart review, lab review, study review, order entry, electronic health record documentation, patient education.  Butler Denmark, AGNP-C, DNP 10/25/2019, 8:45 AM [facility] [address] [place], [place]  [address] [phone]

## 2019-10-25 NOTE — Patient Instructions (Signed)
Continue current medications Sent refills Keep working on the Diabetes :) See you back in 1 year

## 2019-10-25 NOTE — Progress Notes (Signed)
I have read the note, and I agree with the clinical assessment and plan.  [name]   

## 2019-10-28 DIAGNOSIS — Z6834 Body mass index (BMI) 34.0-34.9, adult: Secondary | ICD-10-CM | POA: Diagnosis not present

## 2019-10-28 DIAGNOSIS — M0579 Rheumatoid arthritis with rheumatoid factor of multiple sites without organ or systems involvement: Secondary | ICD-10-CM | POA: Diagnosis not present

## 2019-10-28 DIAGNOSIS — E1165 Type 2 diabetes mellitus with hyperglycemia: Secondary | ICD-10-CM | POA: Diagnosis not present

## 2019-12-03 DIAGNOSIS — E1142 Type 2 diabetes mellitus with diabetic polyneuropathy: Secondary | ICD-10-CM | POA: Diagnosis not present

## 2019-12-03 DIAGNOSIS — E1165 Type 2 diabetes mellitus with hyperglycemia: Secondary | ICD-10-CM | POA: Diagnosis not present

## 2019-12-03 DIAGNOSIS — Z6834 Body mass index (BMI) 34.0-34.9, adult: Secondary | ICD-10-CM | POA: Diagnosis not present

## 2019-12-03 DIAGNOSIS — I10 Essential (primary) hypertension: Secondary | ICD-10-CM | POA: Diagnosis not present

## 2019-12-03 DIAGNOSIS — M797 Fibromyalgia: Secondary | ICD-10-CM | POA: Diagnosis not present

## 2019-12-28 DIAGNOSIS — Z79899 Other long term (current) drug therapy: Secondary | ICD-10-CM | POA: Diagnosis not present

## 2019-12-28 DIAGNOSIS — M79671 Pain in right foot: Secondary | ICD-10-CM | POA: Diagnosis not present

## 2019-12-28 DIAGNOSIS — M1990 Unspecified osteoarthritis, unspecified site: Secondary | ICD-10-CM | POA: Diagnosis not present

## 2019-12-28 DIAGNOSIS — E1140 Type 2 diabetes mellitus with diabetic neuropathy, unspecified: Secondary | ICD-10-CM | POA: Diagnosis not present

## 2019-12-28 DIAGNOSIS — M0579 Rheumatoid arthritis with rheumatoid factor of multiple sites without organ or systems involvement: Secondary | ICD-10-CM | POA: Diagnosis not present

## 2019-12-28 DIAGNOSIS — M797 Fibromyalgia: Secondary | ICD-10-CM | POA: Diagnosis not present

## 2019-12-28 DIAGNOSIS — E669 Obesity, unspecified: Secondary | ICD-10-CM | POA: Diagnosis not present

## 2020-01-24 ENCOUNTER — Other Ambulatory Visit: Payer: Self-pay | Admitting: Neurology

## 2020-04-26 ENCOUNTER — Other Ambulatory Visit: Payer: Self-pay | Admitting: Neurology

## 2020-07-22 ENCOUNTER — Other Ambulatory Visit: Payer: Self-pay | Admitting: Neurology

## 2020-10-20 ENCOUNTER — Other Ambulatory Visit: Payer: Self-pay | Admitting: Neurology

## 2020-10-25 ENCOUNTER — Ambulatory Visit: Payer: 59 | Admitting: Neurology

## 2020-10-25 ENCOUNTER — Ambulatory Visit: Payer: Medicare Other | Admitting: Neurology

## 2020-10-25 ENCOUNTER — Encounter: Payer: Self-pay | Admitting: Neurology

## 2020-10-25 VITALS — BP 122/64 | HR 90 | Ht 61.75 in | Wt 193.1 lb

## 2020-10-25 DIAGNOSIS — M5410 Radiculopathy, site unspecified: Secondary | ICD-10-CM | POA: Diagnosis not present

## 2020-10-25 DIAGNOSIS — M069 Rheumatoid arthritis, unspecified: Secondary | ICD-10-CM | POA: Diagnosis not present

## 2020-10-25 DIAGNOSIS — E1149 Type 2 diabetes mellitus with other diabetic neurological complication: Secondary | ICD-10-CM | POA: Diagnosis not present

## 2020-10-25 HISTORY — DX: Rheumatoid arthritis, unspecified: M06.9

## 2020-10-25 MED ORDER — GABAPENTIN 800 MG PO TABS: 800.0000 mg | ORAL_TABLET | Freq: Two times a day (BID) | ORAL | 3 refills | Status: DC

## 2020-10-25 NOTE — Progress Notes (Signed)
Reason for visit: Diabetic peripheral neuropathy, diabetic radiculitis  [name] is an 65 y.o. female  History of present illness:  [name] is a 65 year old right-handed white female with a history of obesity, diabetes, and a left T5-6 diabetic radiculitis.  She has a diabetic peripheral neuropathy as well.  Over time, she has had resolution of her diabetic radiculitis, but she continues to have tingling and lancinating pains in the feet and some numbness to just below the knees bilaterally.  She has mild gait instability and some postural dizziness when she stands up.  She is on amitriptyline 25 mg at night and reports dry mouth on this.  She takes 60 mg of Cymbalta daily.  She has cut back on her gabapentin within the last week taking 800 mg twice daily and she is taking her Keppra 750 mg twice daily over the last couple months.  She is motivated to try to come off of her medication.  She did have a fall last week and bruised her right side, she is seeing a chiropractor off and on.  She returns to the office today for an evaluation.  Past Medical History:  Diagnosis Date   Bradycardia    Diabetic radiculopathy ([place]) 04/30/2017   High blood pressure    High cholesterol    Rheumatoid arthritis ([place]) 10/25/2020    Past Surgical History:  Procedure Laterality Date   ABDOMINAL HYSTERECTOMY     APPENDECTOMY     hemorhoidectomy     IVC FILTER INSERTION N/A 09/12/2016   Procedure: IVC Filter Insertion;  Surgeon: Conrad , MD;  Location: [facility];  Service: Cardiovascular;  Laterality: N/A;   NECK SURGERY     REFRACTIVE SURGERY     TONSILLECTOMY      Family History  Problem Relation Age of Onset   Hypertension Mother    Cancer Mother    Heart Problems Mother    Heart Problems Father    Diabetes Maternal Grandfather     Social history:  reports that she has quit smoking. She has never used smokeless tobacco. She reports that she does not drink alcohol  and does not use drugs.   No Known Allergies  Medications:  Prior to Admission medications   Medication Sig Start Date End Date Taking? Authorizing Provider  acetaminophen (TYLENOL) 500 MG tablet Take 500 mg by mouth as needed.    Yes [provider]  amitriptyline (ELAVIL) 25 MG tablet Take 25 mg by mouth at bedtime.   Yes [provider]  apixaban (ELIQUIS) 5 MG TABS tablet Take 5 mg by mouth 2 (two) times daily.   Yes [provider]  docusate sodium (COLACE) 100 MG capsule Take 100 mg by mouth daily.   Yes [provider]  DULoxetine (CYMBALTA) 60 MG capsule Take 60 mg by mouth every evening.    Yes [provider]  ezetimibe (ZETIA) 10 MG tablet TAKE ONE TABLET BY MOUTH AT BEDTIME. 11/01/15  Yes [name], [name], MD  folic acid (FOLVITE) 1 MG tablet Take 1 mg by mouth daily.   Yes [provider]  gabapentin (NEURONTIN) 800 MG tablet TAKE ONE TABLET BY MOUTH THREE TIMES DAILY 10/23/20  Yes [name], MD  HUMALOG 100 UNIT/ML injection 10 Units 3 (three) times daily before meals. 10/13/19  Yes [provider]  LANTUS SOLOSTAR 100 UNIT/ML Solostar Pen 40 Units 2 (two) times daily. 09/24/19  Yes [provider]  levETIRAcetam (KEPPRA) 750 MG tablet TAKE ONE TABLET BY MOUTH EVERY MORNING AND TWO TABLETS EVERY EVENING. 10/25/19  Yes Suzzanne Cloud, NP  levothyroxine (SYNTHROID, LEVOTHROID) 88 MCG tablet Take 88 mcg by mouth daily before breakfast.   Yes [provider]  lidocaine (LIDODERM) 5 % APPLY TO THE AFFECTED AREA FOR 12 HOURS THEN REMOVE FOR 12 HOURS. 02/21/18  Yes [provider]  metFORMIN (GLUCOPHAGE-XR) 500 MG 24 hr tablet Take 1,000 mg by mouth 2 (two) times daily.   Yes [provider]  methotrexate (RHEUMATREX) 2.5 MG tablet Take 2.5 mg by mouth once a week. Caution:Chemotherapy. Protect from light. Takes 7 tablets every Monday night only   Yes [provider]   olmesartan-hydrochlorothiazide (BENICAR HCT) 40-25 MG per tablet Take 0.5 tablets by mouth daily.    Yes [provider]  pantoprazole (PROTONIX) 40 MG tablet Take 40 mg by mouth 2 (two) times daily.   Yes [provider]  TRULICITY 1.5 0000000 SOPN INJECT 1.5 SUBCUTANEOUSLY ONCE A WEEK. 01/28/17  Yes [name], [name], MD  vitamin B-12 (CYANOCOBALAMIN) 1000 MCG tablet Take 1,000 mcg by mouth daily.   Yes [provider]    ROS:  Out of a complete 14 system review of symptoms, the patient complains only of the following symptoms, and all other reviewed systems are negative.  Mild gait instability Numbness and tingling in the feet Postural dizziness  Blood pressure 122/64, pulse 90, height 5' 1.75" (1.568 m), weight 193 lb 2 oz (87.6 kg), SpO2 97 %.  Physical Exam  General: The patient is alert and cooperative at the time of the examination.  The patient is markedly obese.  Skin: No significant peripheral edema is noted.   Neurologic Exam  Mental status: The patient is alert and oriented x 3 at the time of the examination. The patient has apparent normal recent and remote memory, with an apparently normal attention span and concentration ability.   Cranial nerves: Facial symmetry is present. Speech is normal, no aphasia or dysarthria is noted. Extraocular movements are full. Visual fields are full.  Motor: The patient has good strength in all 4 extremities.  Sensory examination: Soft touch sensation is symmetric on the face, arms, and legs.  Coordination: The patient has good finger-nose-finger and heel-to-shin bilaterally.  Gait and station: The patient has a normal gait. Tandem gait is unsteady.  Romberg is negative, but is unsteady. No drift is seen.  Reflexes: Deep tendon reflexes are symmetric.   Assessment/Plan:  1.  Diabetic peripheral neuropathy  2.  Left T5-T6 diabetic radiculitis, resolved  3.  Postural dizziness  The patient  is motivated towards coming off some of her medications.  We will reduce the Keppra to 750 mg at night for 2 weeks and then stop the medication.  If her peripheral neuropathy pain worsens, we can restart the medication.  She will continue the gabapentin 800 mg twice daily.  I have asked the patient to talk to her primary care physician about reducing and eliminating the amitriptyline and increasing the Cymbalta for the neuropathy pain.  This may improve her postural dizziness and dry mouth.  The patient will follow up here in 1 year, sooner if needed.  In the future, she can be seen through Dr. Krista Blue.  Jill Alexanders MD 10/25/2020 11:01 AM  [facility] [address] [place], [place] [address]  Phone [phone] Fax [phone]

## 2020-10-25 NOTE — Patient Instructions (Signed)
Take Keppra 750 mg at night for 2 weeks, then stop. If the foot pain worsens we can go back on the medication.

## 2021-01-30 ENCOUNTER — Other Ambulatory Visit: Payer: Self-pay | Admitting: Neurology

## 2021-01-31 ENCOUNTER — Other Ambulatory Visit: Payer: Self-pay

## 2021-01-31 MED ORDER — GABAPENTIN 800 MG PO TABS: 800.0000 mg | ORAL_TABLET | Freq: Two times a day (BID) | ORAL | 2 refills | Status: DC

## 2021-02-05 ENCOUNTER — Other Ambulatory Visit: Payer: Self-pay | Admitting: Neurology

## 2021-02-25 HISTORY — PX: EYE SURGERY: SHX253

## 2021-03-27 ENCOUNTER — Encounter: Payer: Self-pay | Admitting: Internal Medicine

## 2021-03-29 ENCOUNTER — Encounter (INDEPENDENT_AMBULATORY_CARE_PROVIDER_SITE_OTHER): Payer: Self-pay

## 2021-03-29 ENCOUNTER — Ambulatory Visit (INDEPENDENT_AMBULATORY_CARE_PROVIDER_SITE_OTHER): Payer: Medicare Other | Admitting: Gastroenterology

## 2021-03-29 ENCOUNTER — Other Ambulatory Visit: Payer: Self-pay

## 2021-03-29 ENCOUNTER — Encounter (INDEPENDENT_AMBULATORY_CARE_PROVIDER_SITE_OTHER): Payer: Self-pay | Admitting: Gastroenterology

## 2021-03-29 ENCOUNTER — Telehealth (INDEPENDENT_AMBULATORY_CARE_PROVIDER_SITE_OTHER): Payer: Self-pay

## 2021-03-29 ENCOUNTER — Other Ambulatory Visit (INDEPENDENT_AMBULATORY_CARE_PROVIDER_SITE_OTHER): Payer: Self-pay

## 2021-03-29 VITALS — BP 103/58 | HR 108 | Temp 98.4°F | Ht 61.0 in | Wt 194.7 lb

## 2021-03-29 DIAGNOSIS — D509 Iron deficiency anemia, unspecified: Secondary | ICD-10-CM

## 2021-03-29 MED ORDER — CLENPIQ 10-3.5-12 MG-GM -GM/160ML PO SOLN: 1.0000 | Freq: Once | ORAL | 0 refills | Status: AC

## 2021-03-29 NOTE — Patient Instructions (Signed)
It was very nice to meet you We will get you set up for colonoscopy and EGD to further evaluate your low blood counts and low iron, please continue your iron pills at this time.  I will obtain most recent lab work you had done at [facility] Please let me know if you develop any rectal bleeding, black stools, worsening fatigue, shortness of breath, dizziness or you pass out again. These are symptoms of worsening anemia that could require a blood transfusion.

## 2021-03-29 NOTE — H&P (View-Only) (Signed)
Referring Provider: Bonnita Hollow, MD Primary Care Physician:  Bonnita Hollow, MD Primary GI Physician: new patient  Chief Complaint  Patient presents with   Anemia    Patient referred here today due to history of anemia. She denies any dark or bloody stools. She has some issues with weakness and shortness of breath. Has had some dizziness. Last hgb was 7.4 on 03/23/2021, Fe Sat % 4 %. Started Fe 27 mg once per day. States Ed Tax adviser at [employer] told her since hgb had come up to 7.4 she did not need a transfusion.   HPI:   [name] is a 66 y.o. female with past medical history of HTN, high cholesterol, RA, PE, DM type 2, hypothyroidism.   Patient presenting today as a new patient for IDA  Patient had ED visit on 03/21/21 with positive FOBT and hgb of 7.2 at that time  Per reviewed notes, it appears patient has had hx of anemia since atleast 2018, however, hgb has been lower over the past 4 years with most recent 7.4 and MCV 78.1 on 03/23/21 with TIBC 493, Iron 19, saturation of 4, ferritin 10, b12 and folate WNL.   Patient states that she saw rheumatologist who did blood work which showed hgb of 6.7, PCP was contacted and sent patient to ED for possible blood transfusion, however, hgb was over 7 at that time so she did not receive blood. She states that she has a history of anemia, she states that maybe 10-12 years ago she had some very low blood counts and had a colonoscopy with Dr. Anthony Sar, she reports that she had a ruptured hemorrhoid? (Records not available for review) She denies melena or rectal bleeding, she denies any diarrhea or constipation. She takes a stool softener daily due to previous hemorrhoid surgery, this works well for her, she has 1 BM per day without straining. She does endorse some fatigue, sob and dizziness maybe for the past few years. She does tell me that she has had a few syncopal episodes over the past few weeks as well, due to feeling so  weak and dizzy. She started on PO iron on Monday after most recent labs. She denies any abdominal pain, post prandial pain or weight loss. Appetite remains stable.  She had labs done this morning at [facility].   NSAID use: no NSAID use Social hx:no etoh or tobacco Fam hx:no CRC or liver disease  Last Colonoscopy: 10-12 years ago Last Endoscopy:  Recommendations:  Colonoscopy/EGD  Past Medical History:  Diagnosis Date   Bradycardia    Diabetic radiculopathy ([place]) 04/30/2017   High blood pressure    High cholesterol    Rheumatoid arthritis ([place]) 10/25/2020    Past Surgical History:  Procedure Laterality Date   ABDOMINAL HYSTERECTOMY     APPENDECTOMY     hemorhoidectomy     IVC FILTER INSERTION N/A 09/12/2016   Procedure: IVC Filter Insertion;  Surgeon: Conrad Gibsonburg, MD;  Location: [place] [facility];  Service: Cardiovascular;  Laterality: N/A;   NECK SURGERY     REFRACTIVE SURGERY     TONSILLECTOMY      Current Outpatient Medications  Medication Sig Dispense Refill   acetaminophen (TYLENOL) 500 MG tablet Take 500 mg by mouth as needed.      amitriptyline (ELAVIL) 25 MG tablet Take 25 mg by mouth at bedtime.     amLODipine (NORVASC) 5 MG tablet Take 5 mg by mouth daily.  apixaban (ELIQUIS) 5 MG TABS tablet Take 5 mg by mouth 2 (two) times daily.     docusate sodium (COLACE) 100 MG capsule Take 100 mg by mouth daily.     DULoxetine (CYMBALTA) 60 MG capsule Take 60 mg by mouth every evening.      etanercept (ENBREL) 50 MG/ML injection Inject 50 mg into the skin once a week.     ezetimibe (ZETIA) 10 MG tablet TAKE ONE TABLET BY MOUTH AT BEDTIME. 30 tablet 2   ferrous gluconate (IRON 27) 240 (27 FE) MG tablet Take 240 mg by mouth daily at 6 (six) AM.     folic acid (FOLVITE) 1 MG tablet Take 1 mg by mouth daily.     gabapentin (NEURONTIN) 800 MG tablet Take 1 tablet (800 mg total) by mouth 2 (two) times daily. 180 tablet 2   HUMALOG 100 UNIT/ML injection 20 Units 3 (three)  times daily before meals.     LANTUS SOLOSTAR 100 UNIT/ML Solostar Pen 42 Units 2 (two) times daily.     levothyroxine (SYNTHROID, LEVOTHROID) 88 MCG tablet Take 88 mcg by mouth daily before breakfast.     metFORMIN (GLUCOPHAGE-XR) 500 MG 24 hr tablet Take 1,000 mg by mouth 2 (two) times daily.     olmesartan-hydrochlorothiazide (BENICAR HCT) 40-25 MG per tablet Take 1 tablet by mouth daily.     OVER THE COUNTER MEDICATION Mounjoro 0.5 ml once every Thursday.     pantoprazole (PROTONIX) 40 MG tablet Take 40 mg by mouth 2 (two) times daily.     vitamin C (ASCORBIC ACID) 500 MG tablet Take 500 mg by mouth daily.     No current facility-administered medications for this visit.    Allergies as of 03/29/2021   (No Known Allergies)    Family History  Problem Relation Age of Onset   Hypertension Mother    Cancer Mother    Heart Problems Mother    Heart Problems Father    Diabetes Maternal Grandfather     Social History   Socioeconomic History   Marital status: Married    Spouse name: Not on file   Number of children: 2   Years of education: 12   Highest education level: Not on file  Occupational History   Occupation: Unemployed- disabled  Tobacco Use   Smoking status: Former   Smokeless tobacco: Never  Scientific laboratory technician Use: Never used  Substance and Sexual Activity   Alcohol use: No    Alcohol/week: 0.0 standard drinks   Drug use: No   Sexual activity: Not on file  Other Topics Concern   Not on file  Social History Narrative   Lives with spouse   Caffeine use: Coffee every morning (1 cup)   Right handed    Social Determinants of Health   Financial Resource Strain: Not on file  Food Insecurity: Not on file  Transportation Needs: Not on file  Physical Activity: Not on file  Stress: Not on file  Social Connections: Not on file   Review of systems General: negative for night sweats, fever, chills, weight loss +fatigue Neck: Negative for lumps, goiter, pain and  significant neck swelling Resp: Negative for cough, wheezing, +sob CV: Negative for chest pain, leg swelling, palpitations, orthopnea GI: denies melena, hematochezia, nausea, vomiting, diarrhea, constipation, dysphagia, odyonophagia, early satiety or unintentional weight loss.  MSK: Negative for joint pain or swelling, back pain, and muscle pain. Derm: Negative for itching or rash Psych: Denies depression, anxiety, memory  loss, confusion. No homicidal or suicidal ideation.  Heme: Negative for prolonged bleeding, bruising easily, and swollen nodes. Endocrine: Negative for cold or heat intolerance, polyuria, polydipsia and goiter. Neuro: negative for tremor, gait imbalance and seizures. +syncope The remainder of the review of systems is noncontributory.  Physical Exam: BP (!) 103/58 (BP Location: Left Arm, Patient Position: Sitting, Cuff Size: Large)    Pulse (!) 108    Temp 98.4 F (36.9 C) (Oral)    Ht 5\' 1"  (1.549 m)    Wt 194 lb 11.2 oz (88.3 kg)    BMI 36.79 kg/m  General:   Alert and oriented. No distress noted. Pleasant and cooperative.  Head:  Normocephalic and atraumatic. Eyes:  Conjuctiva clear without scleral icterus. Mouth:  Oral mucosa pink and moist. Good dentition. No lesions. Heart: Normal rate and rhythm, s1 and s2 heart sounds present.  Lungs: Clear lung sounds in all lobes. Respirations equal and unlabored. Abdomen:  +BS, soft, non-tender and non-distended. No rebound or guarding. No HSM or masses noted. Derm: No palmar erythema or jaundice Msk:  Symmetrical without gross deformities. Normal posture. Extremities:  Without edema. Neurologic:  Alert and  oriented x4 Psych:  Alert and cooperative. Normal mood and affect.  Invalid input(s): 6 MONTHS   ASSESSMENT: [name] is a 66 y.o. female presenting today for ongoing IDA.  Patient with ongoing, significant anemia over the past few weeks, symptomatic with SOB, dizziness, fatigue and a few syncopal  episodes. Hgb as low as 6.7. it appears she has had anemia since atleast 2018, though she has had no recent endoscopic evaluation. She has no rectal bleeding or melena. She denies constipation or diarrhea, no GI alarm symptoms present. She is currently maintained on Iron pills by her PCP, she should continue this. We will obtain most recent labs that she had done this morning at her PCP office and get her scheduled for EGD and colonoscopy as we cannot rule out PUD, AVMs, Dieulafoy lesions, bleeding polyps or less likely, malignancy. Indications, risks and benefits of procedure discussed in detail with patient. Patient verbalized understanding and is in agreement to proceed with EGD/colonoscopy at this time. Patient will make me aware of any new or worsening symptoms.    PLAN:  Colonoscopy and EGD 2. Continue iron pills 3. Pt to make me aware of new or worsening symptoms 4. Obtain today's labs from [facility]  Blood thinner will need to be held prior to endoscopic evaluation as well as iron supplements  Follow Up: TBD after endoscopic procedures  Martinez Boxx L. Alver Sorrow, MSN, APRN, AGNP-C Adult-Gerontology Nurse Practitioner [facility]

## 2021-03-29 NOTE — Telephone Encounter (Signed)
Memorie Yokoyama Ann Maanvi Lecompte, CMA  ?

## 2021-03-29 NOTE — Progress Notes (Signed)
Referring Provider: Bonnita Hollow, MD Primary Care Physician:  Bonnita Hollow, MD Primary GI Physician: new patient  Chief Complaint  Patient presents with   Anemia    Patient referred here today due to history of anemia. She denies any dark or bloody stools. She has some issues with weakness and shortness of breath. Has had some dizziness. Last hgb was 7.4 on 03/23/2021, Fe Sat % 4 %. Started Fe 27 mg once per day. States Ed Tax adviser at [employer] told her since hgb had come up to 7.4 she did not need a transfusion.   HPI:   [name] is a 66 y.o. female with past medical history of HTN, high cholesterol, RA, PE, DM type 2, hypothyroidism.   Patient presenting today as a new patient for IDA  Patient had ED visit on 03/21/21 with positive FOBT and hgb of 7.2 at that time  Per reviewed notes, it appears patient has had hx of anemia since atleast 2018, however, hgb has been lower over the past 4 years with most recent 7.4 and MCV 78.1 on 03/23/21 with TIBC 493, Iron 19, saturation of 4, ferritin 10, b12 and folate WNL.   Patient states that she saw rheumatologist who did blood work which showed hgb of 6.7, PCP was contacted and sent patient to ED for possible blood transfusion, however, hgb was over 7 at that time so she did not receive blood. She states that she has a history of anemia, she states that maybe 10-12 years ago she had some very low blood counts and had a colonoscopy with Dr. Anthony Sar, she reports that she had a ruptured hemorrhoid? (Records not available for review) She denies melena or rectal bleeding, she denies any diarrhea or constipation. She takes a stool softener daily due to previous hemorrhoid surgery, this works well for her, she has 1 BM per day without straining. She does endorse some fatigue, sob and dizziness maybe for the past few years. She does tell me that she has had a few syncopal episodes over the past few weeks as well, due to feeling so  weak and dizzy. She started on PO iron on Monday after most recent labs. She denies any abdominal pain, post prandial pain or weight loss. Appetite remains stable.  She had labs done this morning at [facility].   NSAID use: no NSAID use Social hx:no etoh or tobacco Fam hx:no CRC or liver disease  Last Colonoscopy: 10-12 years ago Last Endoscopy:  Recommendations:  Colonoscopy/EGD  Past Medical History:  Diagnosis Date   Bradycardia    Diabetic radiculopathy ([place]) 04/30/2017   High blood pressure    High cholesterol    Rheumatoid arthritis ([place]) 10/25/2020    Past Surgical History:  Procedure Laterality Date   ABDOMINAL HYSTERECTOMY     APPENDECTOMY     hemorhoidectomy     IVC FILTER INSERTION N/A 09/12/2016   Procedure: IVC Filter Insertion;  Surgeon: Conrad Christie, MD;  Location: [place] [facility];  Service: Cardiovascular;  Laterality: N/A;   NECK SURGERY     REFRACTIVE SURGERY     TONSILLECTOMY      Current Outpatient Medications  Medication Sig Dispense Refill   acetaminophen (TYLENOL) 500 MG tablet Take 500 mg by mouth as needed.      amitriptyline (ELAVIL) 25 MG tablet Take 25 mg by mouth at bedtime.     amLODipine (NORVASC) 5 MG tablet Take 5 mg by mouth daily.  apixaban (ELIQUIS) 5 MG TABS tablet Take 5 mg by mouth 2 (two) times daily.     docusate sodium (COLACE) 100 MG capsule Take 100 mg by mouth daily.     DULoxetine (CYMBALTA) 60 MG capsule Take 60 mg by mouth every evening.      etanercept (ENBREL) 50 MG/ML injection Inject 50 mg into the skin once a week.     ezetimibe (ZETIA) 10 MG tablet TAKE ONE TABLET BY MOUTH AT BEDTIME. 30 tablet 2   ferrous gluconate (IRON 27) 240 (27 FE) MG tablet Take 240 mg by mouth daily at 6 (six) AM.     folic acid (FOLVITE) 1 MG tablet Take 1 mg by mouth daily.     gabapentin (NEURONTIN) 800 MG tablet Take 1 tablet (800 mg total) by mouth 2 (two) times daily. 180 tablet 2   HUMALOG 100 UNIT/ML injection 20 Units 3 (three)  times daily before meals.     LANTUS SOLOSTAR 100 UNIT/ML Solostar Pen 42 Units 2 (two) times daily.     levothyroxine (SYNTHROID, LEVOTHROID) 88 MCG tablet Take 88 mcg by mouth daily before breakfast.     metFORMIN (GLUCOPHAGE-XR) 500 MG 24 hr tablet Take 1,000 mg by mouth 2 (two) times daily.     olmesartan-hydrochlorothiazide (BENICAR HCT) 40-25 MG per tablet Take 1 tablet by mouth daily.     OVER THE COUNTER MEDICATION Mounjoro 0.5 ml once every Thursday.     pantoprazole (PROTONIX) 40 MG tablet Take 40 mg by mouth 2 (two) times daily.     vitamin C (ASCORBIC ACID) 500 MG tablet Take 500 mg by mouth daily.     No current facility-administered medications for this visit.    Allergies as of 03/29/2021   (No Known Allergies)    Family History  Problem Relation Age of Onset   Hypertension Mother    Cancer Mother    Heart Problems Mother    Heart Problems Father    Diabetes Maternal Grandfather     Social History   Socioeconomic History   Marital status: Married    Spouse name: Not on file   Number of children: 2   Years of education: 12   Highest education level: Not on file  Occupational History   Occupation: Unemployed- disabled  Tobacco Use   Smoking status: Former   Smokeless tobacco: Never  Scientific laboratory technician Use: Never used  Substance and Sexual Activity   Alcohol use: No    Alcohol/week: 0.0 standard drinks   Drug use: No   Sexual activity: Not on file  Other Topics Concern   Not on file  Social History Narrative   Lives with spouse   Caffeine use: Coffee every morning (1 cup)   Right handed    Social Determinants of Health   Financial Resource Strain: Not on file  Food Insecurity: Not on file  Transportation Needs: Not on file  Physical Activity: Not on file  Stress: Not on file  Social Connections: Not on file   Review of systems General: negative for night sweats, fever, chills, weight loss +fatigue Neck: Negative for lumps, goiter, pain and  significant neck swelling Resp: Negative for cough, wheezing, +sob CV: Negative for chest pain, leg swelling, palpitations, orthopnea GI: denies melena, hematochezia, nausea, vomiting, diarrhea, constipation, dysphagia, odyonophagia, early satiety or unintentional weight loss.  MSK: Negative for joint pain or swelling, back pain, and muscle pain. Derm: Negative for itching or rash Psych: Denies depression, anxiety, memory  loss, confusion. No homicidal or suicidal ideation.  Heme: Negative for prolonged bleeding, bruising easily, and swollen nodes. Endocrine: Negative for cold or heat intolerance, polyuria, polydipsia and goiter. Neuro: negative for tremor, gait imbalance and seizures. +syncope The remainder of the review of systems is noncontributory.  Physical Exam: BP (!) 103/58 (BP Location: Left Arm, Patient Position: Sitting, Cuff Size: Large)    Pulse (!) 108    Temp 98.4 F (36.9 C) (Oral)    Ht 5\' 1"  (1.549 m)    Wt 194 lb 11.2 oz (88.3 kg)    BMI 36.79 kg/m  General:   Alert and oriented. No distress noted. Pleasant and cooperative.  Head:  Normocephalic and atraumatic. Eyes:  Conjuctiva clear without scleral icterus. Mouth:  Oral mucosa pink and moist. Good dentition. No lesions. Heart: Normal rate and rhythm, s1 and s2 heart sounds present.  Lungs: Clear lung sounds in all lobes. Respirations equal and unlabored. Abdomen:  +BS, soft, non-tender and non-distended. No rebound or guarding. No HSM or masses noted. Derm: No palmar erythema or jaundice Msk:  Symmetrical without gross deformities. Normal posture. Extremities:  Without edema. Neurologic:  Alert and  oriented x4 Psych:  Alert and cooperative. Normal mood and affect.  Invalid input(s): 6 MONTHS   ASSESSMENT: [name] is a 66 y.o. female presenting today for ongoing IDA.  Patient with ongoing, significant anemia over the past few weeks, symptomatic with SOB, dizziness, fatigue and a few syncopal  episodes. Hgb as low as 6.7. it appears she has had anemia since atleast 2018, though she has had no recent endoscopic evaluation. She has no rectal bleeding or melena. She denies constipation or diarrhea, no GI alarm symptoms present. She is currently maintained on Iron pills by her PCP, she should continue this. We will obtain most recent labs that she had done this morning at her PCP office and get her scheduled for EGD and colonoscopy as we cannot rule out PUD, AVMs, Dieulafoy lesions, bleeding polyps or less likely, malignancy. Indications, risks and benefits of procedure discussed in detail with patient. Patient verbalized understanding and is in agreement to proceed with EGD/colonoscopy at this time. Patient will make me aware of any new or worsening symptoms.    PLAN:  Colonoscopy and EGD 2. Continue iron pills 3. Pt to make me aware of new or worsening symptoms 4. Obtain today's labs from [facility]  Blood thinner will need to be held prior to endoscopic evaluation as well as iron supplements  Follow Up: TBD after endoscopic procedures  Lazarus Sudbury L. Alver Sorrow, MSN, APRN, AGNP-C Adult-Gerontology Nurse Practitioner [facility]

## 2021-04-01 ENCOUNTER — Encounter (INDEPENDENT_AMBULATORY_CARE_PROVIDER_SITE_OTHER): Payer: Self-pay | Admitting: Gastroenterology

## 2021-04-02 ENCOUNTER — Encounter (INDEPENDENT_AMBULATORY_CARE_PROVIDER_SITE_OTHER): Payer: Self-pay

## 2021-04-11 NOTE — Patient Instructions (Signed)
Your procedure is scheduled on: 04/17/2021  Report to [facility] at   10:45  AM.  Call this number if you have problems the morning of surgery: [phone]   Remember:              Follow Directions on the letter you received from Your Physician's office regarding the Bowel Prep              No Smoking the day of Procedure :              Hold Eliquis for 2 days prior to your procedure, last dose 04/14/2021   Take these medicines the morning of surgery with A SIP OF WATER: Amlodipine, Cymbalta, Zetia, Gabapentin,                   levothyroxine, and Pantoprazole                No Diabetic medication and no insulin am of procedure                Take only 1/2 dose of Lantus 20 units the night before procedure   Do not wear jewelry, make-up or nail polish.    Do not bring valuables to the hospital.  Contacts, dentures or bridgework may not be worn into surgery.  .   Patients discharged the day of surgery will not be allowed to drive home.     Colonoscopy, Adult, Care After This sheet gives you information about how to care for yourself after your procedure. Your health care provider may also give you more specific instructions. If you have problems or questions, contact your health care provider. What can I expect after the procedure? After the procedure, it is common to have: A small amount of blood in your stool for 24 hours after the procedure. Some gas. Mild abdominal cramping or bloating.  Follow these instructions at home: General instructions  For the first 24 hours after the procedure: Do not drive or use machinery. Do not sign important documents. Do not drink alcohol. Do your regular daily activities at a slower pace than normal. Eat soft, easy-to-digest foods. Rest often. Take over-the-counter or prescription medicines only as told by your health care provider. It is up to you to get the results of your procedure. Ask your health care provider,  or the department performing the procedure, when your results will be ready. Relieving cramping and bloating Try walking around when you have cramps or feel bloated. Apply heat to your abdomen as told by your health care provider. Use a heat source that your health care provider recommends, such as a moist heat pack or a heating pad. Place a towel between your skin and the heat source. Leave the heat on for 20-30 minutes. Remove the heat if your skin turns bright red. This is especially important if you are unable to feel pain, heat, or cold. You may have a greater risk of getting burned. Eating and drinking Drink enough fluid to keep your urine clear or pale yellow. Resume your normal diet as instructed by your health care provider. Avoid heavy or fried foods that are hard to digest. Avoid drinking alcohol for as long as instructed by your health care provider. Contact a health care provider if: You have blood in your stool 2-3 days after the procedure. Get help right away if: You have more than a small spotting of blood in your stool. You pass large blood  clots in your stool. Your abdomen is swollen. You have nausea or vomiting. You have a fever. You have increasing abdominal pain that is not relieved with medicine. This information is not intended to replace advice given to you by your health care provider. Make sure you discuss any questions you have with your health care provider. Document Released: 09/26/2003 Document Revised: 11/06/2015 Document Reviewed: 04/25/2015 Elsevier Interactive Patient Education  2018 [place] [facility], Adult, Care After This sheet gives you information about how to care for yourself after your procedure. Your health care provider may also give you more specific instructions. If you have problems or questions, contact your health care provider. What can I expect after the procedure? After the procedure, it is common to have: A sore  throat. Mild stomach pain or discomfort. Bloating. Nausea. Follow these instructions at home:  Follow instructions from your health care provider about what to eat or drink after your procedure. Return to your normal activities as told by your health care provider. Ask your health care provider what activities are safe for you. Take over-the-counter and prescription medicines only as told by your health care provider. If you were given a sedative during the procedure, it can affect you for several hours. Do not drive or operate machinery until your health care provider says that it is safe. Keep all follow-up visits as told by your health care provider. This is important. Contact a health care provider if you have: A sore throat that lasts longer than one day. Trouble swallowing. Get help right away if: You vomit blood or your vomit looks like coffee grounds. You have: A fever. Bloody, black, or tarry stools. A severe sore throat or you cannot swallow. Difficulty breathing. Severe pain in your chest or abdomen. Summary After the procedure, it is common to have a sore throat, mild stomach discomfort, bloating, and nausea. If you were given a sedative during the procedure, it can affect you for several hours. Do not drive or operate machinery until your health care provider says that it is safe. Follow instructions from your health care provider about what to eat or drink after your procedure. Return to your normal activities as told by your health care provider. This information is not intended to replace advice given to you by your health care provider. Make sure you discuss any questions you have with your health care provider. Document Revised: 12/18/2018 Document Reviewed: 07/14/2017 Elsevier Patient Education  2022 [employer].

## 2021-04-13 ENCOUNTER — Encounter (INDEPENDENT_AMBULATORY_CARE_PROVIDER_SITE_OTHER): Payer: Self-pay

## 2021-04-13 ENCOUNTER — Encounter (HOSPITAL_COMMUNITY)
Admission: RE | Admit: 2021-04-13 | Discharge: 2021-04-13 | Disposition: A | Discharge status: Home / Self Care | Payer: Medicare Other | Source: Ambulatory Visit | Attending: Gastroenterology | Admitting: Gastroenterology

## 2021-04-13 VITALS — BP 147/66 | HR 87 | Temp 98.4°F | Resp 18 | Ht 61.0 in | Wt 194.7 lb

## 2021-04-13 DIAGNOSIS — D509 Iron deficiency anemia, unspecified: Secondary | ICD-10-CM | POA: Insufficient documentation

## 2021-04-13 DIAGNOSIS — Z01818 Encounter for other preprocedural examination: Secondary | ICD-10-CM | POA: Insufficient documentation

## 2021-04-13 DIAGNOSIS — D649 Anemia, unspecified: Secondary | ICD-10-CM

## 2021-04-13 LAB — CBC WITH DIFFERENTIAL/PLATELET
Abs Immature Granulocytes: 0.04 10*3/uL (ref 0.00–0.07)
Basophils Absolute: 0.1 10*3/uL (ref 0.0–0.1)
Basophils Relative: 0 %
Eosinophils Absolute: 0.3 10*3/uL (ref 0.0–0.5)
Eosinophils Relative: 3 %
HCT: 25.4 % — ABNORMAL LOW (ref 36.0–46.0)
Hemoglobin: 7 g/dL — ABNORMAL LOW (ref 12.0–15.0)
Immature Granulocytes: 0 %
Lymphocytes Relative: 26 %
Lymphs Abs: 2.9 10*3/uL (ref 0.7–4.0)
MCH: 22.3 pg — ABNORMAL LOW (ref 26.0–34.0)
MCHC: 27.6 g/dL — ABNORMAL LOW (ref 30.0–36.0)
MCV: 80.9 fL (ref 80.0–100.0)
Monocytes Absolute: 1.1 10*3/uL — ABNORMAL HIGH (ref 0.1–1.0)
Monocytes Relative: 10 %
Neutro Abs: 6.9 10*3/uL (ref 1.7–7.7)
Neutrophils Relative %: 61 %
Platelets: 303 10*3/uL (ref 150–400)
RBC: 3.14 MIL/uL — ABNORMAL LOW (ref 3.87–5.11)
RDW: 19.2 % — ABNORMAL HIGH (ref 11.5–15.5)
WBC: 11.3 10*3/uL — ABNORMAL HIGH (ref 4.0–10.5)
nRBC: 0 % (ref 0.0–0.2)

## 2021-04-13 LAB — BASIC METABOLIC PANEL
Anion gap: 13 (ref 5–15)
BUN: 18 mg/dL (ref 8–23)
CO2: 23 mmol/L (ref 22–32)
Calcium: 9.5 mg/dL (ref 8.9–10.3)
Chloride: 97 mmol/L — ABNORMAL LOW (ref 98–111)
Creatinine, Ser: 1.21 mg/dL — ABNORMAL HIGH (ref 0.44–1.00)
GFR, Estimated: 50 mL/min — ABNORMAL LOW (ref >60)
Glucose, Bld: 94 mg/dL (ref 70–99)
Potassium: 4.5 mmol/L (ref 3.5–5.1)
Sodium: 133 mmol/L — ABNORMAL LOW (ref 135–145)

## 2021-04-16 ENCOUNTER — Other Ambulatory Visit (INDEPENDENT_AMBULATORY_CARE_PROVIDER_SITE_OTHER): Payer: Self-pay

## 2021-04-16 NOTE — Pre-Procedure Instructions (Signed)
Dr Jenetta Downer and Dr Charna Elizabeth aware of H&H. Will do type and screen per order.

## 2021-04-16 NOTE — Pre-Procedure Instructions (Signed)
Patient unable to come for blood work today. Dr Charna Elizabeth is okay with her having this done in the morning.

## 2021-04-16 NOTE — Pre-Procedure Instructions (Signed)
°  RE: H&H Received: Today Katrina Quale, MD  Encarnacion Chu, RN; Margaree Mackintosh, CMA Hi Nazir Hacker,  Can we get a type and screen for her checked today?  We can get an iSTAT tomorrow for H/H.  Thanks        Previous Messages   ----- Message -----  From: Encarnacion Chu, RN  Sent: 04/16/2021  10:03 AM EST  To: Margaree Mackintosh, CMA, *  Subject: H&H                                             Good morning! Olivia Mackie sent me a message that she messaged you about [name] H&H on Friday and she is off today and wanted me to check on this for her. The patients procedure is tomorrow.

## 2021-04-17 ENCOUNTER — Ambulatory Visit (HOSPITAL_BASED_OUTPATIENT_CLINIC_OR_DEPARTMENT_OTHER): Payer: Medicare Other | Admitting: Anesthesiology

## 2021-04-17 ENCOUNTER — Encounter (HOSPITAL_COMMUNITY)
Admission: RE | Disposition: A | Discharge status: Home / Self Care | Payer: Self-pay | Source: Home / Self Care | Attending: Gastroenterology

## 2021-04-17 ENCOUNTER — Encounter (HOSPITAL_COMMUNITY): Payer: Self-pay | Admitting: Gastroenterology

## 2021-04-17 ENCOUNTER — Ambulatory Visit (HOSPITAL_COMMUNITY)
Admission: RE | Admit: 2021-04-17 | Discharge: 2021-04-17 | Disposition: A | Discharge status: Home / Self Care | Payer: Medicare Other | Attending: Gastroenterology | Admitting: Gastroenterology

## 2021-04-17 ENCOUNTER — Ambulatory Visit (HOSPITAL_COMMUNITY): Payer: Medicare Other | Admitting: Anesthesiology

## 2021-04-17 ENCOUNTER — Encounter (INDEPENDENT_AMBULATORY_CARE_PROVIDER_SITE_OTHER): Payer: Self-pay | Admitting: *Deleted

## 2021-04-17 DIAGNOSIS — Z7984 Long term (current) use of oral hypoglycemic drugs: Secondary | ICD-10-CM | POA: Diagnosis not present

## 2021-04-17 DIAGNOSIS — K317 Polyp of stomach and duodenum: Secondary | ICD-10-CM

## 2021-04-17 DIAGNOSIS — I10 Essential (primary) hypertension: Secondary | ICD-10-CM | POA: Insufficient documentation

## 2021-04-17 DIAGNOSIS — E7800 Pure hypercholesterolemia, unspecified: Secondary | ICD-10-CM | POA: Insufficient documentation

## 2021-04-17 DIAGNOSIS — D125 Benign neoplasm of sigmoid colon: Secondary | ICD-10-CM | POA: Diagnosis not present

## 2021-04-17 DIAGNOSIS — Z794 Long term (current) use of insulin: Secondary | ICD-10-CM | POA: Diagnosis not present

## 2021-04-17 DIAGNOSIS — Z79899 Other long term (current) drug therapy: Secondary | ICD-10-CM | POA: Insufficient documentation

## 2021-04-17 DIAGNOSIS — Z86711 Personal history of pulmonary embolism: Secondary | ICD-10-CM | POA: Insufficient documentation

## 2021-04-17 DIAGNOSIS — K2950 Unspecified chronic gastritis without bleeding: Secondary | ICD-10-CM | POA: Diagnosis not present

## 2021-04-17 DIAGNOSIS — K5730 Diverticulosis of large intestine without perforation or abscess without bleeding: Secondary | ICD-10-CM | POA: Diagnosis not present

## 2021-04-17 DIAGNOSIS — K449 Diaphragmatic hernia without obstruction or gangrene: Secondary | ICD-10-CM | POA: Insufficient documentation

## 2021-04-17 DIAGNOSIS — E039 Hypothyroidism, unspecified: Secondary | ICD-10-CM | POA: Diagnosis not present

## 2021-04-17 DIAGNOSIS — K635 Polyp of colon: Secondary | ICD-10-CM

## 2021-04-17 DIAGNOSIS — Z8719 Personal history of other diseases of the digestive system: Secondary | ICD-10-CM | POA: Insufficient documentation

## 2021-04-17 DIAGNOSIS — Z7901 Long term (current) use of anticoagulants: Secondary | ICD-10-CM | POA: Insufficient documentation

## 2021-04-17 DIAGNOSIS — D649 Anemia, unspecified: Secondary | ICD-10-CM | POA: Diagnosis present

## 2021-04-17 DIAGNOSIS — Z87891 Personal history of nicotine dependence: Secondary | ICD-10-CM | POA: Diagnosis not present

## 2021-04-17 DIAGNOSIS — E119 Type 2 diabetes mellitus without complications: Secondary | ICD-10-CM | POA: Insufficient documentation

## 2021-04-17 DIAGNOSIS — K648 Other hemorrhoids: Secondary | ICD-10-CM | POA: Diagnosis not present

## 2021-04-17 DIAGNOSIS — M069 Rheumatoid arthritis, unspecified: Secondary | ICD-10-CM | POA: Diagnosis not present

## 2021-04-17 DIAGNOSIS — D509 Iron deficiency anemia, unspecified: Secondary | ICD-10-CM | POA: Diagnosis not present

## 2021-04-17 DIAGNOSIS — K219 Gastro-esophageal reflux disease without esophagitis: Secondary | ICD-10-CM | POA: Diagnosis not present

## 2021-04-17 HISTORY — PX: ESOPHAGOGASTRODUODENOSCOPY (EGD) WITH PROPOFOL: SHX5813

## 2021-04-17 HISTORY — PX: POLYPECTOMY: SHX5525

## 2021-04-17 HISTORY — PX: COLONOSCOPY WITH PROPOFOL: SHX5780

## 2021-04-17 LAB — CBC WITH DIFFERENTIAL/PLATELET
Abs Immature Granulocytes: 0.03 10*3/uL (ref 0.00–0.07)
Basophils Absolute: 0.1 10*3/uL (ref 0.0–0.1)
Basophils Relative: 1 %
Eosinophils Absolute: 0.2 10*3/uL (ref 0.0–0.5)
Eosinophils Relative: 2 %
HCT: 24.3 % — ABNORMAL LOW (ref 36.0–46.0)
Hemoglobin: 6.9 g/dL — CL (ref 12.0–15.0)
Immature Granulocytes: 0 %
Lymphocytes Relative: 23 %
Lymphs Abs: 2 10*3/uL (ref 0.7–4.0)
MCH: 22.3 pg — ABNORMAL LOW (ref 26.0–34.0)
MCHC: 28.4 g/dL — ABNORMAL LOW (ref 30.0–36.0)
MCV: 78.6 fL — ABNORMAL LOW (ref 80.0–100.0)
Monocytes Absolute: 0.9 10*3/uL (ref 0.1–1.0)
Monocytes Relative: 10 %
Neutro Abs: 5.6 10*3/uL (ref 1.7–7.7)
Neutrophils Relative %: 64 %
Platelets: 280 10*3/uL (ref 150–400)
RBC: 3.09 MIL/uL — ABNORMAL LOW (ref 3.87–5.11)
RDW: 18.5 % — ABNORMAL HIGH (ref 11.5–15.5)
WBC: 8.8 10*3/uL (ref 4.0–10.5)
nRBC: 0 % (ref 0.0–0.2)

## 2021-04-17 LAB — PREPARE RBC (CROSSMATCH)

## 2021-04-17 LAB — GLUCOSE, CAPILLARY
Glucose-Capillary: 103 mg/dL — ABNORMAL HIGH (ref 70–99)
Glucose-Capillary: 105 mg/dL — ABNORMAL HIGH (ref 70–99)

## 2021-04-17 LAB — ABO/RH: ABO/RH(D): A POS

## 2021-04-17 LAB — HM COLONOSCOPY

## 2021-04-17 SURGERY — COLONOSCOPY WITH PROPOFOL
Anesthesia: General

## 2021-04-17 MED ORDER — LIDOCAINE 2% (20 MG/ML) 5 ML SYRINGE
INTRAMUSCULAR | Status: DC | PRN
  Administered 2021-04-17: 50 mg via INTRAVENOUS

## 2021-04-17 MED ORDER — PHENYLEPHRINE 40 MCG/ML (10ML) SYRINGE FOR IV PUSH (FOR BLOOD PRESSURE SUPPORT)
PREFILLED_SYRINGE | INTRAVENOUS | Status: DC | PRN
Start: 2021-04-17 — End: 2021-04-17
  Administered 2021-04-17: 80 ug via INTRAVENOUS

## 2021-04-17 MED ORDER — SODIUM CHLORIDE 0.9% IV SOLUTION: Freq: Once | INTRAVENOUS | Status: DC

## 2021-04-17 MED ORDER — STERILE WATER FOR IRRIGATION IR SOLN
Status: DC | PRN
  Administered 2021-04-17: 60 mL

## 2021-04-17 MED ORDER — LACTATED RINGERS IV SOLN: INTRAVENOUS | Status: DC

## 2021-04-17 MED ORDER — PROPOFOL 500 MG/50ML IV EMUL
INTRAVENOUS | Status: DC | PRN
  Administered 2021-04-17: 200 ug/kg/min via INTRAVENOUS

## 2021-04-17 MED ORDER — FERROUS GLUCONATE 240 (27 FE) MG PO TABS: 240.0000 mg | ORAL_TABLET | Freq: Two times a day (BID) | ORAL | 1 refills | Status: AC

## 2021-04-17 MED ORDER — PROPOFOL 10 MG/ML IV BOLUS
INTRAVENOUS | Status: DC | PRN
  Administered 2021-04-17: 30 mg via INTRAVENOUS

## 2021-04-17 NOTE — Anesthesia Preprocedure Evaluation (Signed)
Anesthesia Evaluation  Patient identified by MRN, date of birth, ID band Patient awake    Reviewed: Allergy & Precautions, NPO status , Patient's Chart, lab work & pertinent test results  Airway Mallampati: II  TM Distance: >3 FB Neck ROM: Full   Comment: Neck sx Dental  (+) Edentulous Upper, Edentulous Lower   Pulmonary former smoker, PE   Pulmonary exam normal breath sounds clear to auscultation       Cardiovascular Exercise Tolerance: Poor hypertension, Pt. on medications Normal cardiovascular exam Rhythm:Regular Rate:Normal     Neuro/Psych  Neuromuscular disease negative psych ROS   GI/Hepatic Neg liver ROS, GERD  Medicated and Controlled,  Endo/Other  diabetes, Poorly Controlled, Type 2, Insulin Dependent, Oral Hypoglycemic AgentsHypothyroidism   Renal/GU negative Renal ROS  negative genitourinary   Musculoskeletal  (+) Arthritis , Rheumatoid disorders,    Abdominal   Peds negative pediatric ROS (+)  Hematology  (+) Blood dyscrasia, anemia ,   Anesthesia Other Findings Neck sx  Reproductive/Obstetrics negative OB ROS                            Anesthesia Physical Anesthesia Plan  ASA: 3  Anesthesia Plan: General   Post-op Pain Management: Minimal or no pain anticipated   Induction: Intravenous  PONV Risk Score and Plan: TIVA  Airway Management Planned: Nasal Cannula, Natural Airway and Simple Face Mask  Additional Equipment:   Intra-op Plan:   Post-operative Plan:   Informed Consent: I have reviewed the patients History and Physical, chart, labs and discussed the procedure including the risks, benefits and alternatives for the proposed anesthesia with the patient or authorized representative who has indicated his/her understanding and acceptance.     Dental advisory given  Plan Discussed with: CRNA and Surgeon  Anesthesia Plan Comments:         Anesthesia  Quick Evaluation

## 2021-04-17 NOTE — Discharge Instructions (Signed)
You are being discharged to home.  Resume your previous diet.  We are waiting for your pathology results.  Continue pantoprazole 40 mg twice a day Restart Eliquis in 2 days. Return to clinic in 3 months. Increase oral iron to twice a day dosing. If persistent anemia, consider capsule endoscopy. Your physician has recommended a repeat colonoscopy (date to be determined after pending pathology results are reviewed) for screening purposes.

## 2021-04-17 NOTE — Interval H&P Note (Signed)
History and Physical Interval Note:  04/17/2021 7:19 AM  [name]  has presented today for surgery, with the diagnosis of IDA.  The various methods of treatment have been discussed with the patient and family. After consideration of risks, benefits and other options for treatment, the patient has consented to  Procedure(s) with comments: COLONOSCOPY WITH PROPOFOL (N/A) - 1215, moved to 7:30 per Darius Bump at the office, pt knows new arrival time ESOPHAGOGASTRODUODENOSCOPY (EGD) WITH PROPOFOL (N/A) as a surgical intervention.  The patient's history has been reviewed, patient examined, no change in status, stable for surgery.  I have reviewed the patient's chart and labs.  Questions were answered to the patient's satisfaction.     [name]

## 2021-04-17 NOTE — Anesthesia Postprocedure Evaluation (Signed)
Anesthesia Post Note  Patient: [name]  Procedure(s) Performed: COLONOSCOPY WITH PROPOFOL ESOPHAGOGASTRODUODENOSCOPY (EGD) WITH PROPOFOL POLYPECTOMY  Patient location during evaluation: Phase II Anesthesia Type: General Level of consciousness: awake and alert and oriented Pain management: pain level controlled Vital Signs Assessment: post-procedure vital signs reviewed and stable Respiratory status: spontaneous breathing, nonlabored ventilation and respiratory function stable Cardiovascular status: blood pressure returned to baseline and stable Postop Assessment: no apparent nausea or vomiting Anesthetic complications: no   No notable events documented.   Last Vitals:  Vitals:   04/17/21 1138 04/17/21 1145  BP: (!) 148/70 (!) 146/66  Pulse: 81 83  Resp: 15 18  Temp:  37 C  SpO2: 98% 98%    Last Pain:  Vitals:   04/17/21 1145  TempSrc: Oral  PainSc:                  Anaiz Qazi C Siddhartha Hoback

## 2021-04-17 NOTE — Op Note (Signed)
[facility] Patient Name: [name] Procedure Date: 04/17/2021 7:03 AM MRN: [id number] Date of Birth: 11-17-1955 Attending MD: Maylon Peppers ,  CSN: [id number] Age: 66 Admit Type: Outpatient Procedure:                Colonoscopy Indications:              Iron deficiency anemia Providers:                Maylon Peppers, Caprice Kluver, Aram Candela Referring MD:              Medicines:                Monitored Anesthesia Care Complications:            No immediate complications. Estimated Blood Loss:     Estimated blood loss: none. Procedure:                Pre-Anesthesia Assessment:                           - Prior to the procedure, a History and Physical                            was performed, and patient medications, allergies                            and sensitivities were reviewed. The patient's                            tolerance of previous anesthesia was reviewed.                           - The risks and benefits of the procedure and the                            sedation options and risks were discussed with the                            patient. All questions were answered and informed                            consent was obtained.                           After obtaining informed consent, the colonoscope                            was passed under direct vision. Throughout the                            procedure, the patient's blood pressure, pulse, and                            oxygen saturations were monitored continuously. The                            [id number]-HQ190L ([id number]) scope was introduced through  the anus and advanced to the the cecum, identified                            by appendiceal orifice and ileocecal valve. The                            colonoscopy was performed without difficulty. The                            patient tolerated the procedure well. The quality                            of the bowel  preparation was good. Scope In: 8:19:06 AM Scope Out: 8:43:51 AM Scope Withdrawal Time: 0 hours 11 minutes 31 seconds  Total Procedure Duration: 0 hours 24 minutes 45 seconds  Findings:      Hemorrhoids were found on perianal exam.      A 4 mm polyp was found in the sigmoid colon. The polyp was sessile. The       polyp was removed with a cold snare. Resection and retrieval were       complete.      A few small-mouthed diverticula were found in the sigmoid colon.      Non-bleeding internal hemorrhoids were found during retroflexion. The       hemorrhoids were small. Impression:               - Hemorrhoids found on perianal exam.                           - One 4 mm polyp in the sigmoid colon, removed with                            a cold snare. Resected and retrieved.                           - Diverticulosis in the sigmoid colon.                           - Non-bleeding internal hemorrhoids. Moderate Sedation:      Per Anesthesia Care Recommendation:           - Discharge patient to home (ambulatory).                           - Resume previous diet.                           - Await pathology results.                           - Repeat colonoscopy date to be determined after                            pending pathology results are reviewed for                            screening  purposes. Procedure Code(s):        --- Professional ---                           [id number], Colonoscopy, flexible; with removal of                            tumor(s), polyp(s), or other lesion(s) by snare                            technique Diagnosis Code(s):        --- Professional ---                           K64.8, Other hemorrhoids                           K63.5, Polyp of colon                           D50.9, Iron deficiency anemia, unspecified                           K57.30, Diverticulosis of large intestine without                            perforation or abscess without bleeding CPT  copyright 2019 American Medical Association. All rights reserved. The codes documented in this report are preliminary and upon coder review may  be revised to meet current compliance requirements. Maylon Peppers, MD Maylon Peppers,  04/17/2021 8:51:20 AM This report has been signed electronically. Number of Addenda: 0

## 2021-04-17 NOTE — Op Note (Signed)
[facility] Patient Name: [name] Procedure Date: 04/17/2021 7:07 AM MRN: [id number] Date of Birth: 1955/02/28 Attending MD: Maylon Peppers ,  CSN: [id number] Age: 66 Admit Type: Outpatient Procedure:                Upper GI endoscopy Indications:              Iron deficiency anemia Providers:                Maylon Peppers, Hughie Closs RN, RN, Caprice Kluver,                            Aram Candela Referring MD:              Medicines:                Monitored Anesthesia Care Complications:            No immediate complications. Estimated Blood Loss:     Estimated blood loss: none. Procedure:                Pre-Anesthesia Assessment:                           - Prior to the procedure, a History and Physical                            was performed, and patient medications, allergies                            and sensitivities were reviewed. The patient's                            tolerance of previous anesthesia was reviewed.                           - The risks and benefits of the procedure and the                            sedation options and risks were discussed with the                            patient. All questions were answered and informed                            consent was obtained.                           - ASA Grade Assessment: III - A patient with severe                            systemic disease.                           After obtaining informed consent, the endoscope was                            passed under direct vision. Throughout the  procedure, the patient's blood pressure, pulse, and                            oxygen saturations were monitored continuously. The                            [id number]-H190 ([id number]) scope was introduced through the                            mouth, and advanced to the second part of duodenum.                            The upper GI endoscopy was accomplished without                             difficulty. The patient tolerated the procedure                            well. Scope In: 7:39:58 AM Scope Out: 8:12:16 AM Total Procedure Duration: 0 hours 32 minutes 18 seconds  Findings:      A 1 cm sliding hiatal hernia was present.      Multiple 3 to 12 mm pedunculated and sessile polyps with no bleeding and       stigmata of recent bleeding were found in the gastric fundus and in the       gastric body. Six of these polyps were removed with a hot snare.       Resection and retrieval were complete with a Roth net. One of the polyps       could not be removed as it was located at a very acute angle in the       funic area.      The examined duodenum was normal. Upon careful inspection, no presence       of hematin or active bleeding was present. Impression:               - 1 cm hiatal hernia.                           - Multiple gastric polyps. Resected and retrieved.                           - Normal examined duodenum. Moderate Sedation:      Per Anesthesia Care Recommendation:           - Discharge patient to home (ambulatory).                           - Resume previous diet.                           - Await pathology results.                           - Continue pantoprazole 40 mg twice a day                           -  Restart Eliquis in 2 days.                           - Return to clinic in 3 months.                           - Increase oral iron to twice a day dosing.                           - Blood transfusion x1 UPRBC today.                           If persistent anemia, consider capsule endoscopy. Procedure Code(s):        --- Professional ---                           [id number], Esophagogastroduodenoscopy, flexible,                            transoral; with removal of tumor(s), polyp(s), or                            other lesion(s) by snare technique Diagnosis Code(s):        --- Professional ---                           K44.9, Diaphragmatic hernia without  obstruction or                            gangrene                           K31.7, Polyp of stomach and duodenum                           D50.9, Iron deficiency anemia, unspecified CPT copyright 2019 American Medical Association. All rights reserved. The codes documented in this report are preliminary and upon coder review may  be revised to meet current compliance requirements. Maylon Peppers, MD Maylon Peppers,  04/17/2021 8:49:33 AM This report has been signed electronically. Number of Addenda: 0

## 2021-04-17 NOTE — Transfer of Care (Signed)
Immediate Anesthesia Transfer of Care Note  Patient: [name]  Procedure(s) Performed: COLONOSCOPY WITH PROPOFOL ESOPHAGOGASTRODUODENOSCOPY (EGD) WITH PROPOFOL POLYPECTOMY  Patient Location: Short Stay  Anesthesia Type:MAC  Level of Consciousness: awake, alert , oriented and patient cooperative  Airway & Oxygen Therapy: Patient Spontanous Breathing and Patient connected to nasal cannula oxygen  Post-op Assessment: Report given to RN, Post -op Vital signs reviewed and stable and Patient moving all extremities  Post vital signs: Reviewed and stable  Last Vitals:  Vitals Value Taken Time  BP    Temp    Pulse    Resp    SpO2      Last Pain:  Vitals:   04/17/21 0737  TempSrc:   PainSc: 0-No pain      Patients Stated Pain Goal: 5 (93/26/71 2458)  Complications: No notable events documented.

## 2021-04-18 LAB — TYPE AND SCREEN
ABO/RH(D): A POS
Antibody Screen: NEGATIVE
Unit division: 0

## 2021-04-18 LAB — BPAM RBC
Blood Product Expiration Date: 202303212359
ISSUE DATE / TIME: 202302210916
Unit Type and Rh: 6200

## 2021-04-19 LAB — SURGICAL PATHOLOGY

## 2021-04-20 ENCOUNTER — Encounter (HOSPITAL_COMMUNITY): Payer: Self-pay | Admitting: Gastroenterology

## 2021-05-09 ENCOUNTER — Ambulatory Visit: Payer: Medicare Other | Admitting: Internal Medicine

## 2021-07-01 IMAGING — CT CT ABDOMEN AND PELVIS WITH CONTRAST
3 of 6 series · 12 of 46 positions shown, 17 images · IV contrast (omnipaque)
Comparison: 12/17/2016 and prior CTs.  09/17/2016 MR.

CLINICAL DATA: 63-year-old female for follow-up of RIGHT adrenal
masses.

EXAM:
CT ABDOMEN AND PELVIS WITH CONTRAST
TECHNIQUE: Multidetector CT imaging of the abdomen and pelvis was performed
using the standard protocol following bolus administration of
intravenous contrast.
CONTRAST:  100mL OMNIPAQUE IOHEXOL 300 MG/ML  SOLN

[Series 4: axial st · axial · 0.83mm/px · z∈[-249,+126]mm · 8 of 97 slices shown, 13 images]
[im 11/97  soft-tissue]
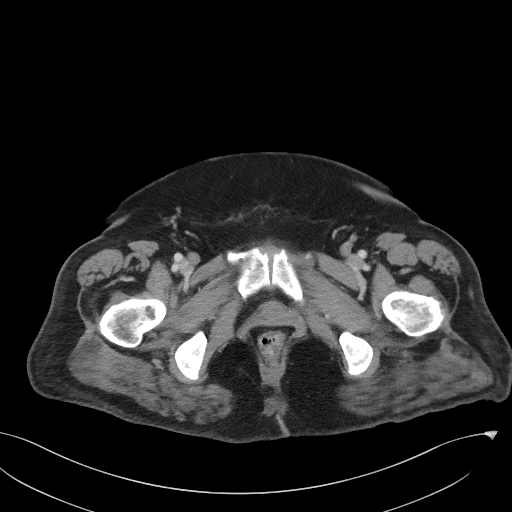
[im 11/97  bone]
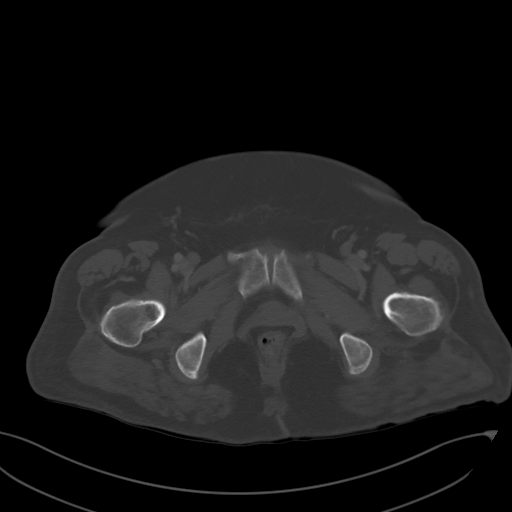
[im 22/97  soft-tissue]
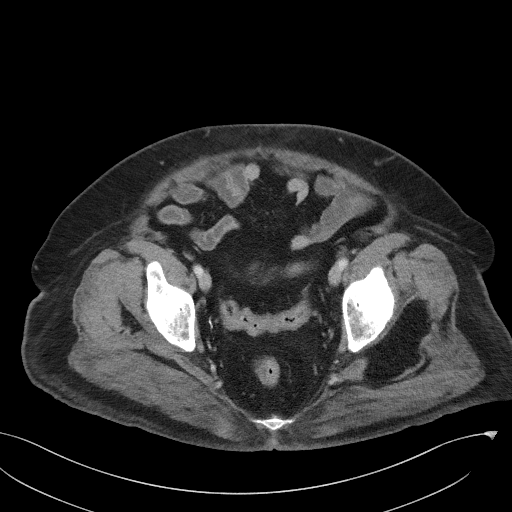
[im 33/97  soft-tissue]
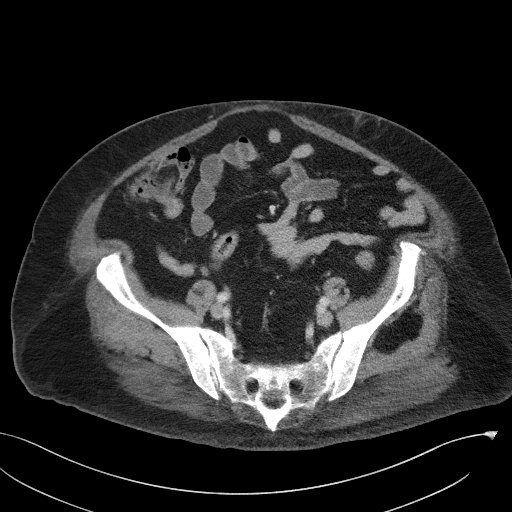
[im 43/97  soft-tissue]
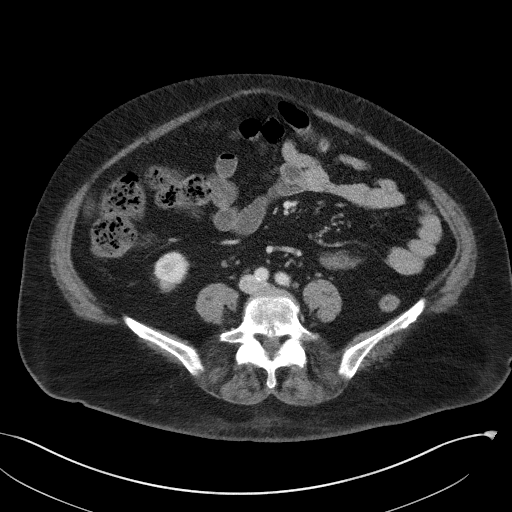
[im 54/97  soft-tissue]
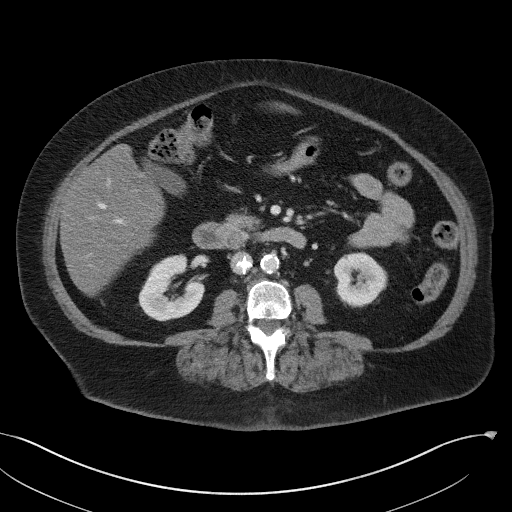
[im 54/97  lung]
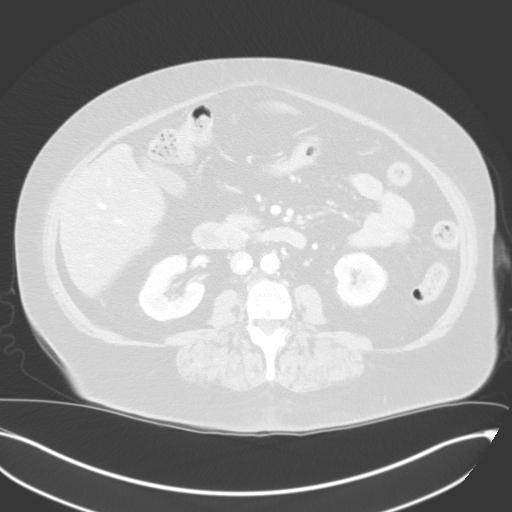
[im 65/97  soft-tissue]
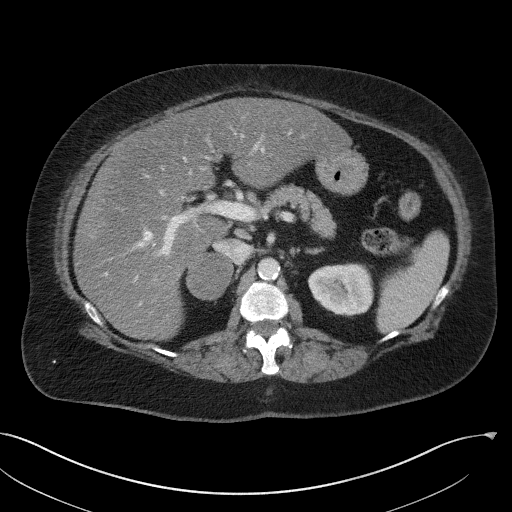
[im 65/97  lung]
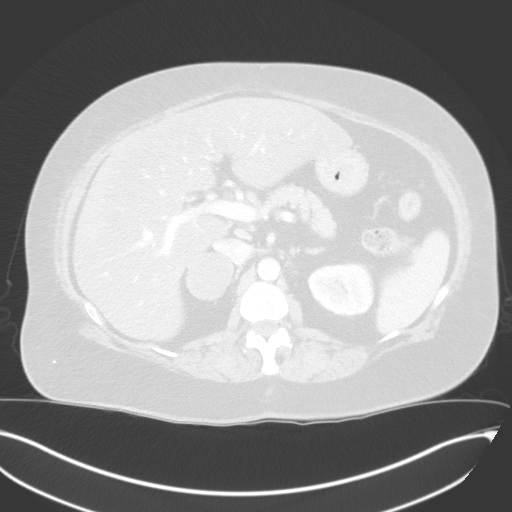
[im 75/97  soft-tissue]
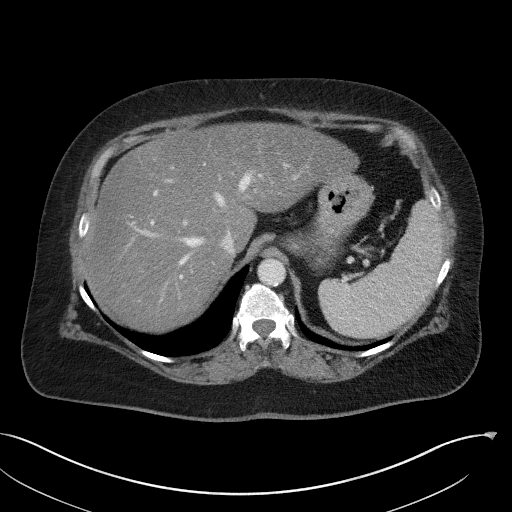
[im 75/97  lung]
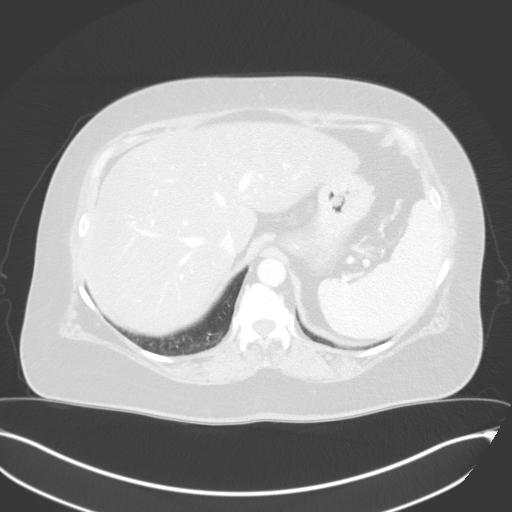
[im 86/97  soft-tissue]
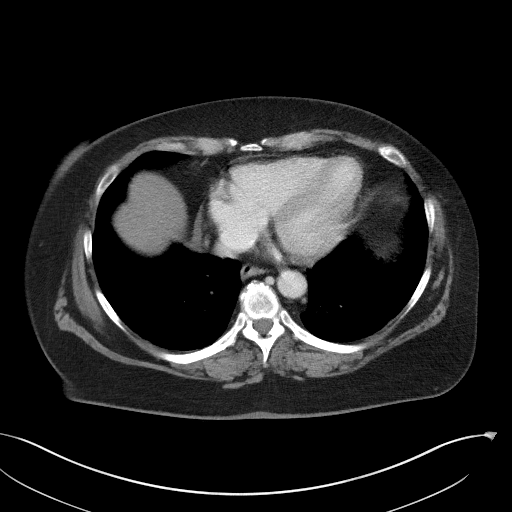
[im 86/97  lung]
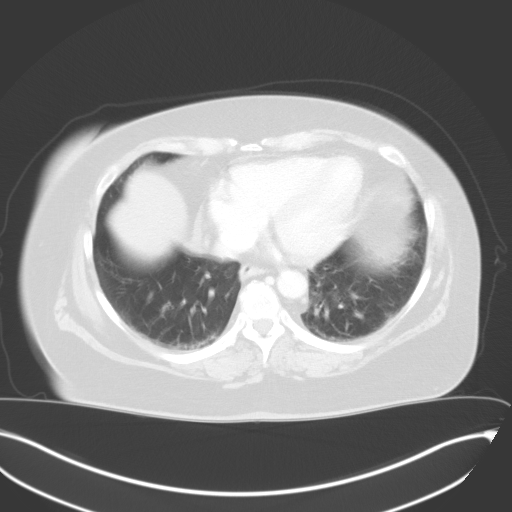

[Series 7: coronal st · coronal · 0.84mm/px · 3 of 126 slices shown]
[im 42/126  soft-tissue]
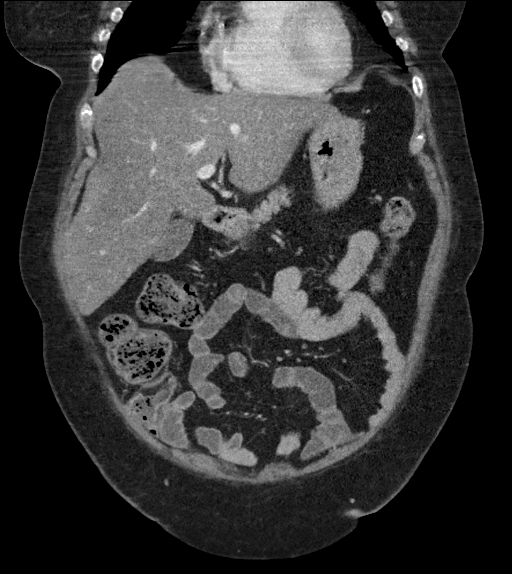
[im 56/126  soft-tissue]
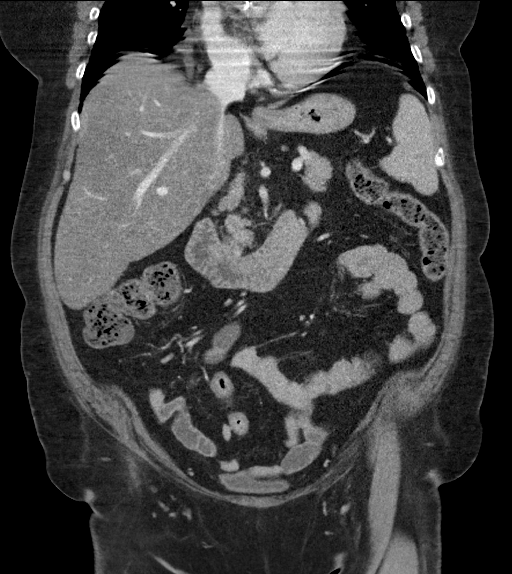
[im 70/126  soft-tissue]
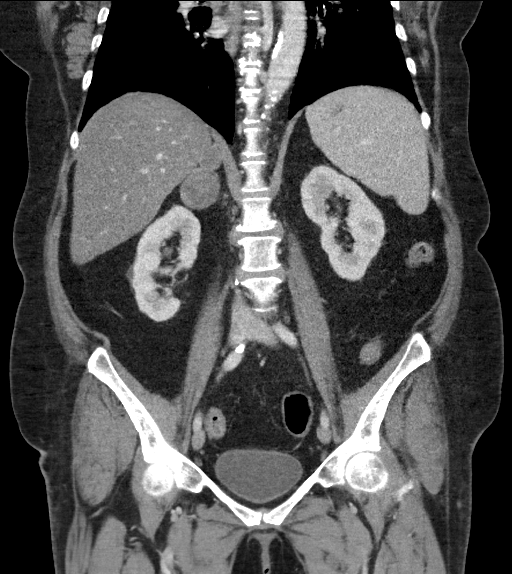

[Series 8: sagittal st · sagittal · 0.75mm/px · 1 of 135 slices shown]
[im 45/135  soft-tissue]
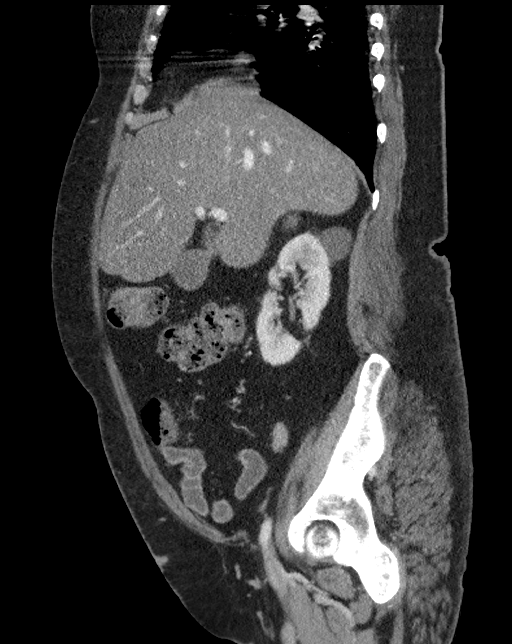

[12 of 46 positions shown; findings below may reference images not displayed]

FINDINGS: Lower chest: Cardiomegaly again noted.  No acute abnormality.

Hepatobiliary: Hepatic steatosis noted without focal hepatic lesion.
The gallbladder is unremarkable. No biliary dilatation.

Pancreas: Unremarkable

Spleen: Unremarkable

Adrenals/Urinary Tract: A 4.3 x 3.6 cm RIGHT adrenal mass is
unchanged in size since 09/17/2016. A 2 x 2.3 cm UPPER RIGHT adrenal
adenoma is again noted. No new adrenal abnormalities are present.

The kidneys, LEFT adrenal gland and bladder are unremarkable except
for a stable RIGHT renal cyst.

Stomach/Bowel: Stomach is within normal limits. No evidence of bowel
wall thickening, distention, or inflammatory changes.

Vascular/Lymphatic: Aortic atherosclerosis. No enlarged abdominal or
pelvic lymph nodes. An IVC filter is again noted.

Reproductive: Status post hysterectomy. No adnexal masses.

Other: No free fluid, abdominal wall hernia, pneumoperitoneum or
focal collection.

Musculoskeletal: No acute or suspicious bony abnormalities. A
moderate LEFT gluteal lipoma is unchanged.
IMPRESSION: 1. Little significant change of 4.3 cm RIGHT adrenal mass,
compatible with a benign lesion given stability since 09/17/2016. No
further imaging follow-up recommended unless there is concern for a
functioning adrenal lesion/adenoma.
2. Unchanged 2.3 cm RIGHT adrenal adenoma.
3. Hepatic steatosis
4. Cardiomegaly
5.  Aortic Atherosclerosis (I141R-MR4.4).

## 2021-07-02 ENCOUNTER — Other Ambulatory Visit: Payer: Self-pay | Admitting: Neurology

## 2021-07-02 NOTE — Telephone Encounter (Signed)
Pt would like a call from the nurse to discuss gabapentin (NEURONTIN) 800 MG tablet refill. Only have 2 days of medication left. ?

## 2021-07-03 MED ORDER — GABAPENTIN 800 MG PO TABS: 800.0000 mg | ORAL_TABLET | Freq: Three times a day (TID) | ORAL | 1 refills | Status: DC

## 2021-07-03 NOTE — Telephone Encounter (Signed)
The patient is under our care for diabetic radiculopathy. She was last seen 10/15/20 by Dr. Jannifer Franklin. At that time, she was taking gabapentin '800mg'$ , one cap BID. She had previously taken it TID. Her symptoms worsened since last seen and she self-increased her dose back to gabapentin '800mg'$ , one cap TID. She is asking for a new prescription to be sent to the pharmacy. ? ?The plan was for her to follow up yearly. She has a pending appt w/ Sarah on 10/25/21.  ?

## 2021-07-16 ENCOUNTER — Encounter (INDEPENDENT_AMBULATORY_CARE_PROVIDER_SITE_OTHER): Payer: Self-pay | Admitting: Gastroenterology

## 2021-07-16 ENCOUNTER — Ambulatory Visit (INDEPENDENT_AMBULATORY_CARE_PROVIDER_SITE_OTHER): Payer: Medicare Other | Admitting: Gastroenterology

## 2021-07-16 DIAGNOSIS — D500 Iron deficiency anemia secondary to blood loss (chronic): Secondary | ICD-10-CM

## 2021-07-16 DIAGNOSIS — K317 Polyp of stomach and duodenum: Secondary | ICD-10-CM | POA: Diagnosis not present

## 2021-07-16 DIAGNOSIS — D509 Iron deficiency anemia, unspecified: Secondary | ICD-10-CM | POA: Insufficient documentation

## 2021-07-16 NOTE — Progress Notes (Signed)
Maylon Peppers, M.D. Gastroenterology & Hepatology [facility] [address] [place], [place] [address]  Primary Care Physician: Bonnita Hollow, MD [place] [id number]  I will communicate my assessment and recommendations to the referring MD via EMR.  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 female with past medical history of diabetes, diabetic radiculopathy, hypertension, hyperlipidemia, PE, rheumatoid arthritis, who presents for follow up of iron deficiency anemia.  The patient was last seen on 03/29/2021. At that time, the patient was scheduled to undergo EGD and colonoscopy.Both procedures were performed on 04/17/2021.  EGD showed a 1 cm hiatal hernia, there was presence of multiple polyps in the fundus and gastric body.  6 polyps were removed with hot snare (pathology came back positive for hyperplastic polyps negative for H. pylori).  The polyps that were removed had an inflammatory surface.  Duodenum was normal.  Colonoscopy showed a 4 mm polyp in the sigmoid colon (1 tubular adenoma), diverticulosis, internal and external hemorrhoids were found.  Patient was recommended to increase her iron to twice a day dosing and to have a repeat colonoscopy in 7 years.  Patient states that she has felt well and denies having any complaints.  She brings her most recent blood work-up performed on 06/08/2021 which showed a hemoglobin of 11.9, MCV of 90, although cell count 11.0, platelets 259, iron 68, ferritin 37, folic acid more than 20, vitamin B12 275, TIBC 396.  She denies having any nausea, vomiting, fever, chills, hematochezia, melena, hematemesis, abdominal distention, abdominal pain, diarrhea, jaundice, pruritus or weight loss.  Last EGD: As above Last Colonoscopy: As above  Past Medical History: Past Medical History:  Diagnosis  Date   Bradycardia    Diabetes ([place])    Diabetic radiculopathy ([place]) 04/30/2017   High blood pressure    High cholesterol    Pulmonary embolism ([facility])    Rheumatoid arthritis ([place]) 10/25/2020    Past Surgical History: Past Surgical History:  Procedure Laterality Date   ABDOMINAL HYSTERECTOMY     APPENDECTOMY     COLONOSCOPY WITH PROPOFOL N/A 04/17/2021   Procedure: COLONOSCOPY WITH PROPOFOL;  Surgeon: Harvel Quale, MD;  [facility];  Service: Gastroenterology;  Laterality: N/A;  1215, moved to 7:30 per Darius Bump at the office, pt knows new arrival time   ESOPHAGOGASTRODUODENOSCOPY (EGD) WITH PROPOFOL N/A 04/17/2021   Procedure: ESOPHAGOGASTRODUODENOSCOPY (EGD) WITH PROPOFOL;  Surgeon: Harvel Quale, MD;  [facility];  Service: Gastroenterology;  Laterality: N/A;   hemorhoidectomy     IVC FILTER INSERTION N/A 09/12/2016   Procedure: IVC Filter Insertion;  Surgeon: Conrad Shoreacres, MD;  Location: [place] [facility];  Service: Cardiovascular;  Laterality: N/A;   NECK SURGERY     POLYPECTOMY  04/17/2021   Procedure: POLYPECTOMY;  Surgeon: Harvel Quale, MD;  [facility];  Service: Gastroenterology;;   REFRACTIVE SURGERY     TONSILLECTOMY      Family History: Family History  Problem Relation Age of Onset   Hypertension Mother    Cancer Mother    Heart Problems Mother    Heart Problems Father    Diabetes Maternal Grandfather     Social History: Social History   Tobacco Use  Smoking Status Former  Smokeless Tobacco Never   Social History   Substance and Sexual Activity  Alcohol Use  No   Alcohol/week: 0.0 standard drinks   Social History   Substance and Sexual Activity  Drug Use No    Allergies: No Known Allergies  Medications: Current Outpatient Medications  Medication Sig Dispense Refill   acetaminophen (TYLENOL) 500 MG tablet Take 500 mg by mouth as needed.      amitriptyline (ELAVIL) 25 MG  tablet Take 25 mg by mouth at bedtime.     amLODipine (NORVASC) 5 MG tablet Take 5 mg by mouth daily.     apixaban (ELIQUIS) 5 MG TABS tablet Take 5 mg by mouth 2 (two) times daily.     docusate sodium (COLACE) 100 MG capsule Take 100 mg by mouth daily.     DULoxetine (CYMBALTA) 60 MG capsule Take 60 mg by mouth every evening.      etanercept (ENBREL) 50 MG/ML injection Inject 50 mg into the skin once a week. Monday     ezetimibe (ZETIA) 10 MG tablet TAKE ONE TABLET BY MOUTH AT BEDTIME. 30 tablet 2   ferrous gluconate (FERGON) 240 (27 FE) MG tablet Take 1 tablet (240 mg total) by mouth 2 (two) times daily. 106 tablet 1   folic acid (FOLVITE) 1 MG tablet Take 1 mg by mouth daily.     gabapentin (NEURONTIN) 800 MG tablet Take 1 tablet (800 mg total) by mouth 3 (three) times daily. (Patient taking differently: Take 800 mg by mouth 2 (two) times daily.) 270 tablet 1   HUMALOG 100 UNIT/ML injection 20 Units 3 (three) times daily before meals.     LANTUS SOLOSTAR 100 UNIT/ML Solostar Pen 40-42 Units See admin instructions. Take 40 units in the morning and 40 units in the evening     levothyroxine (SYNTHROID) 100 MCG tablet Take 100 mcg by mouth daily before breakfast.     metFORMIN (GLUCOPHAGE-XR) 500 MG 24 hr tablet Take 1,000 mg by mouth 2 (two) times daily.     MOUNJARO 5 MG/0.5ML Pen Inject 5 mg into the skin every Thursday.     olmesartan-hydrochlorothiazide (BENICAR HCT) 40-25 MG per tablet Take 1 tablet by mouth in the morning.     pantoprazole (PROTONIX) 40 MG tablet Take 40 mg by mouth 2 (two) times daily.     Red Yeast Rice Extract 600 MG CAPS Take 1,200 mg by mouth daily at 6 (six) AM.     vitamin C (ASCORBIC ACID) 500 MG tablet Take 500 mg by mouth daily.     No current facility-administered medications for this visit.    Review of Systems: GENERAL: negative for malaise, night sweats HEENT: No changes in hearing or vision, no nose bleeds or other nasal problems. NECK: Negative for  lumps, goiter, pain and significant neck swelling RESPIRATORY: Negative for cough, wheezing CARDIOVASCULAR: Negative for chest pain, leg swelling, palpitations, orthopnea GI: SEE HPI MUSCULOSKELETAL: Negative for joint pain or swelling, back pain, and muscle pain. SKIN: Negative for lesions, rash PSYCH: Negative for sleep disturbance, mood disorder and recent psychosocial stressors. HEMATOLOGY Negative for prolonged bleeding, bruising easily, and swollen nodes. ENDOCRINE: Negative for cold or heat intolerance, polyuria, polydipsia and goiter. NEURO: negative for tremor, gait imbalance, syncope and seizures. The remainder of the review of systems is noncontributory.   Physical Exam: BP 128/75 (BP Location: Right Arm, Patient Position: Sitting, Cuff Size: Large)   Pulse 97   Temp 98.6 F (37 C) (Oral)   Ht 5' 1.5" (1.562 m)   Wt 189 lb 14.4 oz (86.1 kg)   BMI 35.30 kg/m  GENERAL: The patient is AO x3, in no acute distress. HEENT: Head is normocephalic and atraumatic. EOMI are intact. Mouth is well hydrated and without lesions. NECK: Supple. No masses LUNGS: Clear to auscultation. No presence of rhonchi/wheezing/rales. Adequate chest expansion HEART: RRR, normal s1 and s2. ABDOMEN: Soft, nontender, no guarding, no peritoneal signs, and nondistended. BS +. No masses. EXTREMITIES: Without any cyanosis, clubbing, rash, lesions or edema. NEUROLOGIC: AOx3, no focal motor deficit. SKIN: no jaundice, no rashes  Imaging/Labs: as above  I personally reviewed and interpreted the available labs, imaging and endoscopic files.  Impression and Plan: [name] is a 66 y.o. female with past medical history of diabetes, diabetic radiculopathy, hypertension, hyperlipidemia, PE, rheumatoid arthritis, who presents for follow up of iron deficiency anemia.  The patient has presented improvement of her iron deficiency anemia after undergoing endoscopic resection of the hyperplastic  inflammatory polyps.  She has responded to oral iron supplementation and has not presented any overt gastrointestinal bleeding symptoms.  Due to this, I recommended her to decrease the dose of oral iron to once a day dosing.  Can eventually consider stopping the oral iron intake in 6 months if her hemoglobin remains stable.  Patient understood and agreed.  - Consider decreasing oral iron to once a day if Hb >12.5   All questions were answered.      Harvel Quale, MD Gastroenterology and Hepatology [facility]

## 2021-07-16 NOTE — Patient Instructions (Signed)
Can consider decreasing oral iron to once a day if Hb >12.5

## 2021-07-16 NOTE — Progress Notes (Signed)
Patient reports that she has felt well. Had labs performed by her PCP and her Hb has improved up to . She is still ferrous gluconate twice a day and has tolerated the medicine adequately.  She has not felt any fatigue, lightheadedness, SOB or chest pain anymore.  The patient denies having any nausea, vomiting, fever, chills, hematochezia, melena, hematemesis, abdominal distention, abdominal pain, diarrhea, jaundice, pruritus or weight loss.

## 2021-07-31 ENCOUNTER — Encounter (INDEPENDENT_AMBULATORY_CARE_PROVIDER_SITE_OTHER): Payer: Self-pay

## 2021-10-09 ENCOUNTER — Other Ambulatory Visit: Payer: Self-pay | Admitting: Neurology

## 2021-10-21 NOTE — Progress Notes (Unsigned)
Patient: [name] Date of Birth: 09-21-1955  Reason for Visit: Follow up History from: Patient, husband Primary Neurologist: Dr. Krista Blue  ASSESSMENT AND PLAN 66 y.o. year old female   1.  Diabetic peripheral neuropathy 2.  Left T5-T6 diabetic radiculitis, resolved -Denica is doing very well, symptoms are under good control/improved -Has been able to reduce dose of gabapentin to 800 mg twice a day, has come off [place] on Cymbalta and amitriptyline from PCP -I refilled gabapentin, PCP can refill going forward, return here as needed basis  HISTORY OF PRESENT ILLNESS: Today 10/22/21 [name] is here today for follow-up. Is off Keppra. Taking gabapentin 800 mg twice daily. Still on Cymbalta on amitriptyline. No falls, balance is good. Had anemia 6.9, had infusion, no source found. DM is up and down. A1C 5.7 most recent. Is on [place]. Diabetic radiculitis has resolved.  Neuropathy is under good control.  Is not bothersome, does not keep her awake.  HISTORY 10/25/2020 Dr. Jannifer Franklin: [name] is a 66 year old right-handed white female with a history of obesity, diabetes, and a left T5-6 diabetic radiculitis.  She has a diabetic peripheral neuropathy as well.  Over time, she has had resolution of her diabetic radiculitis, but she continues to have tingling and lancinating pains in the feet and some numbness to just below the knees bilaterally.  She has mild gait instability and some postural dizziness when she stands up.  She is on amitriptyline 25 mg at night and reports dry mouth on this.  She takes 60 mg of Cymbalta daily.  She has cut back on her gabapentin within the last week taking 800 mg twice daily and she is taking her Keppra 750 mg twice daily over the last couple months.  She is motivated to try to come off of her medication.  She did have a fall last week and bruised her right side, she is seeing a chiropractor off and on.  She returns to the office  today for an evaluation.   REVIEW OF SYSTEMS: Out of a complete 14 system review of symptoms, the patient complains only of the following symptoms, and all other reviewed systems are negative.  See HPI  ALLERGIES: No Known Allergies  HOME MEDICATIONS: Outpatient Medications Prior to Visit  Medication Sig Dispense Refill   acetaminophen (TYLENOL) 500 MG tablet Take 500 mg by mouth as needed.      amitriptyline (ELAVIL) 25 MG tablet Take 25 mg by mouth at bedtime.     amLODipine (NORVASC) 5 MG tablet Take 5 mg by mouth daily.     apixaban (ELIQUIS) 5 MG TABS tablet Take 5 mg by mouth 2 (two) times daily.     docusate sodium (COLACE) 100 MG capsule Take 100 mg by mouth daily.     DULoxetine (CYMBALTA) 60 MG capsule Take 60 mg by mouth every evening.      etanercept (ENBREL) 50 MG/ML injection Inject 50 mg into the skin once a week. Monday     ezetimibe (ZETIA) 10 MG tablet TAKE ONE TABLET BY MOUTH AT BEDTIME. 30 tablet 2   ferrous gluconate (FERGON) 240 (27 FE) MG tablet Take 1 tablet (240 mg total) by mouth 2 (two) times daily. 128 tablet 1   folic acid (FOLVITE) 1 MG tablet Take 1 mg by mouth daily.     gabapentin (NEURONTIN) 800 MG tablet Take 1 tablet (800 mg total) by mouth 3 (three) times daily. (Patient taking differently: Take 800  mg by mouth 2 (two) times daily.) 270 tablet 1   HUMALOG 100 UNIT/ML injection 20 Units 3 (three) times daily before meals.     LANTUS SOLOSTAR 100 UNIT/ML Solostar Pen 40-42 Units See admin instructions. Take 40 units in the morning and 40 units in the evening     levothyroxine (SYNTHROID) 100 MCG tablet Take 100 mcg by mouth daily before breakfast.     metFORMIN (GLUCOPHAGE-XR) 500 MG 24 hr tablet Take 1,000 mg by mouth 2 (two) times daily.     MOUNJARO 5 MG/0.5ML Pen Inject 5 mg into the skin every Thursday.     olmesartan-hydrochlorothiazide (BENICAR HCT) 40-25 MG per tablet Take 1 tablet by mouth in the morning.     pantoprazole (PROTONIX) 40 MG  tablet Take 40 mg by mouth 2 (two) times daily.     Red Yeast Rice Extract 600 MG CAPS Take 1,200 mg by mouth daily at 6 (six) AM.     vitamin C (ASCORBIC ACID) 500 MG tablet Take 500 mg by mouth daily.     No facility-administered medications prior to visit.    PAST MEDICAL HISTORY: Past Medical History:  Diagnosis Date   Bradycardia    Diabetes ([place])    Diabetic radiculopathy ([place]) 04/30/2017   High blood pressure    High cholesterol    Pulmonary embolism ([facility])    Rheumatoid arthritis ([place]) 10/25/2020    PAST SURGICAL HISTORY: Past Surgical History:  Procedure Laterality Date   ABDOMINAL HYSTERECTOMY     APPENDECTOMY     COLONOSCOPY WITH PROPOFOL N/A 04/17/2021   Procedure: COLONOSCOPY WITH PROPOFOL;  Surgeon: Harvel Quale, MD;  [facility];  Service: Gastroenterology;  Laterality: N/A;  1215, moved to 7:30 per Darius Bump at the office, pt knows new arrival time   ESOPHAGOGASTRODUODENOSCOPY (EGD) WITH PROPOFOL N/A 04/17/2021   Procedure: ESOPHAGOGASTRODUODENOSCOPY (EGD) WITH PROPOFOL;  Surgeon: Harvel Quale, MD;  [facility];  Service: Gastroenterology;  Laterality: N/A;   hemorhoidectomy     IVC FILTER INSERTION N/A 09/12/2016   Procedure: IVC Filter Insertion;  Surgeon: Conrad [place], MD;  Location: [place] [facility];  Service: Cardiovascular;  Laterality: N/A;   NECK SURGERY     POLYPECTOMY  04/17/2021   Procedure: POLYPECTOMY;  Surgeon: Harvel Quale, MD;  [facility];  Service: Gastroenterology;;   REFRACTIVE SURGERY     TONSILLECTOMY      FAMILY HISTORY: Family History  Problem Relation Age of Onset   Hypertension Mother    Cancer Mother    Heart Problems Mother    Heart Problems Father    Diabetes Maternal Grandfather     SOCIAL HISTORY: Social History   Socioeconomic History   Marital status: Married    Spouse name: Not on file   Number of children: 2   Years of education: 12    Highest education level: Not on file  Occupational History   Occupation: Unemployed- disabled  Tobacco Use   Smoking status: Former   Smokeless tobacco: Never  Scientific laboratory technician Use: Never used  Substance and Sexual Activity   Alcohol use: No    Alcohol/week: 0.0 standard drinks of alcohol   Drug use: No   Sexual activity: Not on file  Other Topics Concern   Not on file  Social History Narrative   Lives with spouse   Caffeine use: Coffee every morning (1 cup)   Right handed    Social  Determinants of Health   Financial Resource Strain: Not on file  Food Insecurity: Not on file  Transportation Needs: Not on file  Physical Activity: Not on file  Stress: Not on file  Social Connections: Not on file  Intimate Partner Violence: Not on file    PHYSICAL EXAM  Vitals:   10/22/21 0907  BP: (!) 150/76  Pulse: 91  Weight: 189 lb (85.7 kg)  Height: $Remove'5\' 1"'[id number]$  (1.549 m)   Body mass index is 35.71 kg/m.  Generalized: Well developed, in no acute distress, well-appearing/stressed Neurological examination  Mentation: Alert oriented to time, place, history taking. Follows all commands speech and language fluent.  Very pleasant Cranial nerve II-XII: Pupils were equal round reactive to light. Extraocular movements were full, visual field were full on confrontational test. Facial sensation and strength were normal.  Head turning and shoulder shrug  were normal and symmetric. Motor: The motor testing reveals 5 over 5 strength of all 4 extremities. Good symmetric motor tone is noted throughout.  Sensory: Sensory testing is intact to soft touch on all 4 extremities. No evidence of extinction is noted.  Coordination: Cerebellar testing reveals good finger-nose-finger and heel-to-shin bilaterally.  Gait and station: Gait is normal.  Reflexes: Deep tendon reflexes are symmetric and normal bilaterally.   DIAGNOSTIC DATA (LABS, IMAGING, TESTING) - I reviewed patient records, labs, notes, testing  and imaging myself where available.  Lab Results  Component Value Date   WBC 8.8 04/17/2021   HGB 6.9 (LL) 04/17/2021   HCT 24.3 (L) 04/17/2021   MCV 78.6 (L) 04/17/2021   PLT 280 04/17/2021      Component Value Date/Time   NA 133 (L) 04/13/2021 1411   K 4.5 04/13/2021 1411   CL 97 (L) 04/13/2021 1411   CO2 23 04/13/2021 1411   GLUCOSE 94 04/13/2021 1411   BUN 18 04/13/2021 1411   CREATININE 1.21 (H) 04/13/2021 1411   CALCIUM 9.5 04/13/2021 1411   PROT 6.4 01/20/2008 2225   ALBUMIN 2.9 (L) 01/20/2008 2225   AST 15 01/20/2008 2225   ALT 22 01/20/2008 2225   ALKPHOS 66 01/20/2008 2225   BILITOT 0.7 01/20/2008 2225   GFRNONAA 50 (L) 04/13/2021 1411   GFRAA  06/12/2009 1155    >60        The eGFR has been calculated using the MDRD equation. This calculation has not been validated in all clinical situations. eGFR's persistently <60 mL/min signify possible Chronic Kidney Disease.   Lab Results  Component Value Date   CHOL  01/21/2008    174        ATP III CLASSIFICATION:  <200     mg/dL   Desirable  200-239  mg/dL   Borderline High  >=240    mg/dL   High   HDL 28 (L) 01/21/2008   LDLCALC (H) 01/21/2008    112        Total Cholesterol/HDL:CHD Risk Coronary Heart Disease Risk Table                     Men   Women  1/2 Average Risk   3.4   3.3   TRIG 168 (H) 01/21/2008   CHOLHDL 6.2 01/21/2008   Lab Results  Component Value Date   HGBA1C 9.8 06/14/2015   Lab Results  Component Value Date   VITAMINB12 418 01/20/2008     Butler Denmark, AGNP-C, DNP 10/22/2021, 9:18 AM [facility] [address] [place], [place] [address] [phone]

## 2021-10-22 ENCOUNTER — Ambulatory Visit: Payer: Medicare Other | Admitting: Neurology

## 2021-10-22 DIAGNOSIS — E1142 Type 2 diabetes mellitus with diabetic polyneuropathy: Secondary | ICD-10-CM

## 2021-10-22 MED ORDER — GABAPENTIN 800 MG PO TABS
800.0000 mg | ORAL_TABLET | Freq: Two times a day (BID) | ORAL | 1 refills | Status: DC
Start: 2021-10-22 — End: 2022-02-05

## 2021-10-22 NOTE — Patient Instructions (Signed)
We will continue gabapentin  Have you primary care doctor refill going forward In the past, Dr. Jannifer Franklin mentioned coming off the amitriptyline and increasing Cymbalta if needed for neuropathy discomfort

## 2021-10-25 ENCOUNTER — Ambulatory Visit: Payer: Medicare Other | Admitting: Neurology

## 2021-11-08 ENCOUNTER — Encounter (INDEPENDENT_AMBULATORY_CARE_PROVIDER_SITE_OTHER): Payer: Self-pay | Admitting: Gastroenterology

## 2021-11-08 ENCOUNTER — Ambulatory Visit (INDEPENDENT_AMBULATORY_CARE_PROVIDER_SITE_OTHER): Payer: Medicare Other | Admitting: Gastroenterology

## 2021-11-08 VITALS — BP 110/73 | HR 89 | Temp 99.3°F | Ht 61.0 in | Wt 185.5 lb

## 2021-11-08 DIAGNOSIS — K643 Fourth degree hemorrhoids: Secondary | ICD-10-CM | POA: Diagnosis not present

## 2021-11-08 NOTE — Progress Notes (Signed)
Referring Provider: Bonnita Hollow, MD Primary Care Physician:  Janine Limbo, PA-C Primary GI Physician: Jenetta Downer   Chief Complaint  Patient presents with   Hemorrhoids    Patient here today with concerns over hemorrhoids on going since September 2. She is having some pain and bright red blood. Has been using Nitroglycerine0.125 % apply rectally two times daily, and lidocaine ointment tid.   HPI:   [name] is a 66 y.o. female with past medical history of diabetes, diabetic radiculopathy, hypertension, hyperlipidemia, PE, rheumatoid arthritis  Patient presenting today for hemorrhoids.  Last seen 07/16/21, doing well at that time with no complaints, most recent labs  06/08/2021 which showed a hemoglobin of 11.9, MCV of 90, although cell count 11.0, platelets 259, iron 68, ferritin 37, folic acid more than 20, vitamin B12 275, TIBC 396.  Recommended to have oral iron decreased to once daily, consider stopping iron if hgb remains stable x6 months.  Per PCP notes, patient presented for rectal itching and pain with rectal exam revealing 3 large non thrombosed hemorrhoids, she was advised to do sitz baths, and given 0.125% nitroglycerin ointment BID   Present:  She reports that she has had issues with hemorrhoids since earlier this month. She notes that she has discomfort when she has a BM, also with some itching. Has discomfort when trying to sit. Denies constipation. Having a BM once per day. Hemorrhoids began bleeding a couple of days ago, she notes that she was sitting on her couch with a pad underneath her and saw blood present there when she got up. She is seeing blood in the toilet water and on the toilet tissue. Husband reports it does not appear to be a lot of blood. She has not been able to sit down really until today. Using lidocaine and nitro ointment from PCP without much relief.  She is sitting on a donut pillow. Takes a stool softener, stools ar soft and she is  not straining or sitting on the toilet for long periods of time. History of hemorrhoid surgery in 2011. Has tried preparation H without any resolution of symptoms. She notes that hemorrhoids presenting out of her rectum and stay outside of her rectum.   Last Colonoscopy:2/21/234 mm polyp in the sigmoid colon (1 tubular adenoma), diverticulosis, internal and external hemorrhoids were found Last Endoscopy:04/17/21 1 cm hiatal hernia, there was presence of multiple polyps in the fundus and gastric body.  6 polyps were removed with hot snare (pathology came back positive for hyperplastic polyps negative for H. pylori).  The polyps that were removed had an inflammatory surface.  Duodenum was normal.  Recommendations:  Repeat colonoscopy in 7 years  Past Medical History:  Diagnosis Date   Bradycardia    Diabetes ([place])    Diabetic radiculopathy ([place]) 04/30/2017   High blood pressure    High cholesterol    Pulmonary embolism ([facility])    Rheumatoid arthritis ([place]) 10/25/2020    Past Surgical History:  Procedure Laterality Date   ABDOMINAL HYSTERECTOMY     APPENDECTOMY     COLONOSCOPY WITH PROPOFOL N/A 04/17/2021   Procedure: COLONOSCOPY WITH PROPOFOL;  Surgeon: Harvel Quale, MD;  [facility];  Service: Gastroenterology;  Laterality: N/A;  1215, moved to 7:30 per Darius Bump at the office, pt knows new arrival time   ESOPHAGOGASTRODUODENOSCOPY (EGD) WITH PROPOFOL N/A 04/17/2021   Procedure: ESOPHAGOGASTRODUODENOSCOPY (EGD) WITH PROPOFOL;  Surgeon: Harvel Quale, MD;  [facility];  Service: Gastroenterology;  Laterality: N/A;   hemorhoidectomy     IVC FILTER INSERTION N/A 09/12/2016   Procedure: IVC Filter Insertion;  Surgeon: Conrad Mahnomen, MD;  Location: [place] [facility];  Service: Cardiovascular;  Laterality: N/A;   NECK SURGERY     POLYPECTOMY  04/17/2021   Procedure: POLYPECTOMY;  Surgeon: Harvel Quale, MD;  [facility];   Service: Gastroenterology;;   REFRACTIVE SURGERY     TONSILLECTOMY      Current Outpatient Medications  Medication Sig Dispense Refill   acetaminophen (TYLENOL) 500 MG tablet Take 500 mg by mouth as needed.      amitriptyline (ELAVIL) 25 MG tablet Take 25 mg by mouth at bedtime.     amLODipine (NORVASC) 5 MG tablet Take 5 mg by mouth daily.     apixaban (ELIQUIS) 5 MG TABS tablet Take 5 mg by mouth 2 (two) times daily.     docusate sodium (COLACE) 100 MG capsule Take 100 mg by mouth daily.     DULoxetine (CYMBALTA) 60 MG capsule Take 60 mg by mouth every evening.      etanercept (ENBREL) 50 MG/ML injection Inject 50 mg into the skin once a week. Monday     ezetimibe (ZETIA) 10 MG tablet TAKE ONE TABLET BY MOUTH AT BEDTIME. 30 tablet 2   ferrous gluconate (FERGON) 240 (27 FE) MG tablet Take 1 tablet (240 mg total) by mouth 2 (two) times daily. 361 tablet 1   folic acid (FOLVITE) 1 MG tablet Take 1 mg by mouth daily.     gabapentin (NEURONTIN) 800 MG tablet Take 1 tablet (800 mg total) by mouth 2 (two) times daily. 180 tablet 1   HUMALOG 100 UNIT/ML injection 20 Units 3 (three) times daily before meals.     LANTUS SOLOSTAR 100 UNIT/ML Solostar Pen 40-42 Units See admin instructions. Take 40 units in the morning and 40 units in the evening     levothyroxine (SYNTHROID) 100 MCG tablet Take 100 mcg by mouth daily before breakfast.     lidocaine (XYLOCAINE) 5 % ointment SMARTSIG:sparingly Topical Every 6-8 Hours PRN     metFORMIN (GLUCOPHAGE-XR) 500 MG 24 hr tablet Take 1,000 mg by mouth 2 (two) times daily.     MOUNJARO 5 MG/0.5ML Pen Inject 5 mg into the skin every Thursday.     olmesartan-hydrochlorothiazide (BENICAR HCT) 40-25 MG per tablet Take 1 tablet by mouth in the morning.     OVER THE COUNTER MEDICATION Nitroglycerine0.125 % apply rectally daily     pantoprazole (PROTONIX) 40 MG tablet Take 40 mg by mouth 2 (two) times daily.     Red Yeast Rice Extract 600 MG CAPS Take 1,200 mg by  mouth daily at 6 (six) AM.     vitamin C (ASCORBIC ACID) 500 MG tablet Take 500 mg by mouth daily.     No current facility-administered medications for this visit.    Allergies as of 11/08/2021   (No Known Allergies)    Family History  Problem Relation Age of Onset   Hypertension Mother    Cancer Mother    Heart Problems Mother    Heart Problems Father    Diabetes Maternal Grandfather     Social History   Socioeconomic History   Marital status: Married    Spouse name: Not on file   Number of children: 2   Years of education: 12   Highest education level: Not on file  Occupational History   Occupation:  Unemployed- disabled  Tobacco Use   Smoking status: Former   Smokeless tobacco: Never  Scientific laboratory technician Use: Never used  Substance and Sexual Activity   Alcohol use: No    Alcohol/week: 0.0 standard drinks of alcohol   Drug use: No   Sexual activity: Not on file  Other Topics Concern   Not on file  Social History Narrative   Lives with spouse   Caffeine use: Coffee every morning (1 cup)   Right handed    Social Determinants of Health   Financial Resource Strain: Not on file  Food Insecurity: Not on file  Transportation Needs: Not on file  Physical Activity: Not on file  Stress: Not on file  Social Connections: Not on file   Review of systems General: negative for malaise, night sweats, fever, chills, weight loss Neck: Negative for lumps, goiter, pain and significant neck swelling Resp: Negative for cough, wheezing, dyspnea at rest CV: Negative for chest pain, leg swelling, palpitations, orthopnea GI: denies melena,  nausea, vomiting, diarrhea, constipation, dysphagia, odyonophagia, early satiety or unintentional weight loss. +rectal pain +bleeding hemorrhoids  MSK: Negative for joint pain or swelling, back pain, and muscle pain. Derm: Negative for itching or rash Psych: Denies depression, anxiety, memory loss, confusion. No homicidal or suicidal  ideation.  Heme: Negative for prolonged bleeding, bruising easily, and swollen nodes. Endocrine: Negative for cold or heat intolerance, polyuria, polydipsia and goiter. Neuro: negative for tremor, gait imbalance, syncope and seizures. The remainder of the review of systems is noncontributory.  Physical Exam: BP 110/73 (BP Location: Left Arm, Patient Position: Sitting, Cuff Size: Large)   Pulse 89   Temp 99.3 F (37.4 C) (Oral)   Ht '5\' 1"'$  (1.549 m)   Wt 185 lb 8 oz (84.1 kg)   BMI 35.05 kg/m  General:   Alert and oriented. No distress noted. Pleasant and cooperative.  Head:  Normocephalic and atraumatic. Eyes:  Conjuctiva clear without scleral icterus. Mouth:  Oral mucosa pink and moist. Good dentition. No lesions. Heart: Normal rate and rhythm, s1 and s2 heart sounds present.  Lungs: Clear lung sounds in all lobes. Respirations equal and unlabored. Abdomen:  +BS, soft, non-tender and non-distended. No rebound or guarding. No HSM or masses noted. Rectal: wendy richards, LPN present as witness, one large and two smaller non thrombosed, external hemorrhoids, appearing as grade IV, no recent stigmata of bleeding. No internal exam done due to patient discomfort, no obvious fissures noted Derm: No palmar erythema or jaundice Msk:  Symmetrical without gross deformities. Normal posture. Extremities:  Without edema. Neurologic:  Alert and  oriented x4 Psych:  Alert and cooperative. Normal mood and affect.  Invalid input(s): "6 MONTHS"   ASSESSMENT: Gricel Copen is a 66 y.o. female presenting today for hemorrhoids.  Painful hemorrhoids since 9/2 with recent bleeding over the past few days, PCP gave lidocaine and nitro ointment which has not provided much relief. Rectal exam reveals one large, non thrombosed hemorrhoid and two smaller non thrombosed hemorrhoids, appearing as grade IV, no evidence of recent bleeding. Discussed conservative management with hemorrhoid compound cream  to help relieve pain and shrink size of hemorrhoids, however, she ultimately may need a surgical referral for evaluation of hemorrhoidectomy. We discussed she is not a candidate for hemorrhoid banding due to grade of her hemorrhoids. Should continue with stool softener for soft to loose BMs, avoid straining and limit toilet time. If bleeding becomes heavier in nature, she will let me know  PLAN:  Hemorrhoid compound cream (hydrocortisone,lidocaine, pramoxine, phenylephrine) 2. Continue with soft to looser stools  3. Avoid straining and limit toilet time to <5 minutes 4. Consider surgical evaluation for hemorrhoidectomy if no improvement with conservative treatment  All questions were answered, patient verbalized understanding and is in agreement with plan as outlined above.    Follow Up: 1 month   Wilber Fini L. Alver Sorrow, MSN, APRN, AGNP-C Adult-Gerontology Nurse Practitioner [facility]

## 2021-11-08 NOTE — Patient Instructions (Signed)
I am sending a compound cream to [employer] that you will apply up to 4x/day to hemorrhoids. Continue with stool softener to have very soft to loose stools, avoid straining and limit toilet time to <5 minutes Will see you back in 1 month, if hemorrhoids are not improved, we may need to consider surgical evaluation. If you have heavier bleeding from hemorrhoids, please let me know  Follow up 1 month

## 2021-11-12 ENCOUNTER — Encounter (INDEPENDENT_AMBULATORY_CARE_PROVIDER_SITE_OTHER): Payer: Self-pay | Admitting: Gastroenterology

## 2021-12-11 ENCOUNTER — Encounter: Payer: Self-pay | Admitting: Gastroenterology

## 2021-12-11 ENCOUNTER — Ambulatory Visit: Payer: Medicare Other | Admitting: Gastroenterology

## 2021-12-11 DIAGNOSIS — K642 Third degree hemorrhoids: Secondary | ICD-10-CM | POA: Insufficient documentation

## 2021-12-11 NOTE — Progress Notes (Unsigned)
Gastroenterology Office Note     Primary Care Physician:  Janine Limbo, PA-C  Primary Gastroenterologist: Dr. Jenetta Downer    Chief Complaint   Chief Complaint  Patient presents with   Follow-up    One month follow up. Hemorrhoids better but some bleeding. Pt states hemorrhoids were better in the past     History of Present Illness   [name] is a 66 y.o. female presenting today in follow-up with a history of  diabetes, diabetic radiculopathy, hypertension, hyperlipidemia, PE, rheumatoid arthritis, IDA, recently seen about a month ago with symptomatic hemorrhoids. Here for close interval follow-up.  Patient notes improving swelling. Needs refill on compounded cream ((hydrocortisone,lidocaine, pramoxine, phenylephrine). Using this 4 times a day. Pain is better but still present. Still with prolapsing. Intermittent bleeding. Wears panty liner. No constipation. Takes colace every morning. No straining. No more than 5 minutes on the toilet. Would like to avoid surgery if possible.      Last Colonoscopy:2/21/234 mm polyp in the sigmoid colon (1 tubular adenoma), diverticulosis, internal and external hemorrhoids were found Last Endoscopy:04/17/21 1 cm hiatal hernia, there was presence of multiple polyps in the fundus and gastric body.  6 polyps were removed with hot snare (pathology came back positive for hyperplastic polyps negative for H. pylori).  The polyps that were removed had an inflammatory surface.  Duodenum was normal.   Recommendations:  Repeat colonoscopy in 7 years   Past Medical History:  Diagnosis Date   Bradycardia    Diabetes ([place])    Diabetic radiculopathy ([place]) 04/30/2017   High blood pressure    High cholesterol    Pulmonary embolism ([facility])    Rheumatoid arthritis ([place]) 10/25/2020    Past Surgical History:  Procedure Laterality Date   ABDOMINAL HYSTERECTOMY     APPENDECTOMY     COLONOSCOPY WITH PROPOFOL N/A 04/17/2021   Procedure:  COLONOSCOPY WITH PROPOFOL;  Surgeon: Harvel Quale, MD;  [facility];  Service: Gastroenterology;  Laterality: N/A;  1215, moved to 7:30 per Darius Bump at the office, pt knows new arrival time   ESOPHAGOGASTRODUODENOSCOPY (EGD) WITH PROPOFOL N/A 04/17/2021   Procedure: ESOPHAGOGASTRODUODENOSCOPY (EGD) WITH PROPOFOL;  Surgeon: Harvel Quale, MD;  [facility];  Service: Gastroenterology;  Laterality: N/A;   hemorhoidectomy     IVC FILTER INSERTION N/A 09/12/2016   Procedure: IVC Filter Insertion;  Surgeon: Conrad [place], MD;  Location: [place] [facility];  Service: Cardiovascular;  Laterality: N/A;   NECK SURGERY     POLYPECTOMY  04/17/2021   Procedure: POLYPECTOMY;  Surgeon: Harvel Quale, MD;  [facility];  Service: Gastroenterology;;   REFRACTIVE SURGERY     TONSILLECTOMY      Current Outpatient Medications  Medication Sig Dispense Refill   acetaminophen (TYLENOL) 500 MG tablet Take 500 mg by mouth as needed.      amitriptyline (ELAVIL) 25 MG tablet Take 25 mg by mouth at bedtime.     amLODipine (NORVASC) 5 MG tablet Take 5 mg by mouth daily.     apixaban (ELIQUIS) 5 MG TABS tablet Take 5 mg by mouth 2 (two) times daily.     docusate sodium (COLACE) 100 MG capsule Take 100 mg by mouth daily.     DULoxetine (CYMBALTA) 60 MG capsule Take 60 mg by mouth every evening.      etanercept (ENBREL) 50 MG/ML injection Inject 50 mg into the skin once a week. Monday  ezetimibe (ZETIA) 10 MG tablet TAKE ONE TABLET BY MOUTH AT BEDTIME. 30 tablet 2   ferrous gluconate (FERGON) 240 (27 FE) MG tablet Take 1 tablet (240 mg total) by mouth 2 (two) times daily. 086 tablet 1   folic acid (FOLVITE) 1 MG tablet Take 1 mg by mouth daily.     gabapentin (NEURONTIN) 800 MG tablet Take 1 tablet (800 mg total) by mouth 2 (two) times daily. 180 tablet 1   HUMALOG 100 UNIT/ML injection 20 Units 3 (three) times daily before meals.     LANTUS  SOLOSTAR 100 UNIT/ML Solostar Pen 40-42 Units See admin instructions. Take 40 units in the morning and 40 units in the evening     levothyroxine (SYNTHROID) 100 MCG tablet Take 100 mcg by mouth daily before breakfast.     lidocaine (XYLOCAINE) 5 % ointment SMARTSIG:sparingly Topical Every 6-8 Hours PRN     metFORMIN (GLUCOPHAGE-XR) 500 MG 24 hr tablet Take 1,000 mg by mouth 2 (two) times daily.     MOUNJARO 5 MG/0.5ML Pen Inject 5 mg into the skin every Thursday.     NONFORMULARY OR COMPOUNDED ITEM Compounded cream from [facility] - called in 30 gram tube on 11/08/21 Hydrocortisone 2.5% Lidocaine 3% Pramoxine 1% Phenylephrine 0.25%  Apply rectally up to four times daily as directed     olmesartan-hydrochlorothiazide (BENICAR HCT) 40-25 MG per tablet Take 1 tablet by mouth in the morning.     pantoprazole (PROTONIX) 40 MG tablet Take 40 mg by mouth 2 (two) times daily.     Red Yeast Rice Extract 600 MG CAPS Take 1,200 mg by mouth daily at 6 (six) AM.     vitamin C (ASCORBIC ACID) 500 MG tablet Take 500 mg by mouth daily.     OVER THE COUNTER MEDICATION Nitroglycerine0.125 % apply rectally daily     No current facility-administered medications for this visit.    Allergies as of 12/11/2021   (No Known Allergies)    Family History  Problem Relation Age of Onset   Hypertension Mother    Cancer Mother    Heart Problems Mother    Heart Problems Father    Diabetes Maternal Grandfather     Social History   Socioeconomic History   Marital status: Married    Spouse name: Not on file   Number of children: 2   Years of education: 12   Highest education level: Not on file  Occupational History   Occupation: Unemployed- disabled  Tobacco Use   Smoking status: Former   Smokeless tobacco: Never  Scientific laboratory technician Use: Never used  Substance and Sexual Activity   Alcohol use: No    Alcohol/week: 0.0 standard drinks of alcohol   Drug use: No   Sexual activity: Not on file   Other Topics Concern   Not on file  Social History Narrative   Lives with spouse   Caffeine use: Coffee every morning (1 cup)   Right handed    Social Determinants of Health   Financial Resource Strain: Not on file  Food Insecurity: Not on file  Transportation Needs: Not on file  Physical Activity: Not on file  Stress: Not on file  Social Connections: Not on file  Intimate Partner Violence: Not on file     Review of Systems   Gen: Denies any fever, chills, fatigue, weight loss, lack of appetite.  CV: Denies chest pain, heart palpitations, peripheral edema, syncope.  Resp: Denies shortness of breath at  rest or with exertion. Denies wheezing or cough.  GI: see HPI GU : Denies urinary burning, urinary frequency, urinary hesitancy MS: Denies joint pain, muscle weakness, cramps, or limitation of movement.  Derm: Denies rash, itching, dry skin Psych: Denies depression, anxiety, memory loss, and confusion Heme: Denies bruising, bleeding, and enlarged lymph nodes.   Physical Exam   BP (!) 145/79   Pulse (!) 102   Temp 98.4 F (36.9 C)   Ht '5\' 1"'$  (1.549 m)   Wt 186 lb 3.2 oz (84.5 kg)   BMI 35.18 kg/m  General:   Alert and oriented. Pleasant and cooperative. Well-nourished and well-developed.  Head:  Normocephalic and atraumatic. Eyes:  Without icterus Rectal:  non-thrombosed large external hemorrhoids, multiple skin tags, DRE incomplete but appears to have improved prolapsing internal hemorrhoids. Discomfort on DRE.  Msk:  Symmetrical without gross deformities. Normal posture. Extremities:  Without edema. Neurologic:  Alert and  oriented x4 Skin:  Intact without significant lesions or rashes. Psych:  Alert and cooperative. Normal mood and affect.   Assessment   [name] is a 66 y.o. female presenting today in follow-up with a history of diabetes, diabetic radiculopathy, hypertension, hyperlipidemia, PE, rheumatoid arthritis, IDA, recently seen about a  month ago with symptomatic hemorrhoids. Here for close interval follow-up.  She has had improvement in external hemorrhoid swelling and some internal hemorrhoid swelling. Pain improved. Still with intermittent bleeding. She desires to avoid surgery. Appears clinically to be Grade 3 hemorrhoids now. We discussed surgical referral due to prior presentation and slow progress. However, she wants to avoid surgery if at all possible. We discussed banding and that it may not be successful in light of the extent of her hemorrhoids; as long as remains Grade 3, could band hemorrhoids. However, she is on anticoagulation, which increases potential for post-banding bleeding. I discussed this was not an absolute contraindication, but I would need to review this further with Dr. Jenetta Downer.   PLAN   Continue cream 3-4 times per day 4 week close follow-up   Annitta Needs, PhD, ANP-BC [facility]   I have reviewed the note and agree with the APP's assessment as described in this progress note  We discussed the possibility of doing hemorrhoidal banding while on anticoagulation.  As this is not an absolute contraindication, we will proceed with this which was thoroughly explained to the patient.  Maylon Peppers, MD Gastroenterology and Hepatology [facility]

## 2021-12-11 NOTE — Patient Instructions (Signed)
I have refilled the cream for you. Continue to avoid straining, limit toilet time, and avoid constipation.  I will talk to Dr. Jenetta Downer about potential banding!  I will see you in 3-4 weeks!  It was a pleasure to see you today. I want to create trusting relationships with patients to provide genuine, compassionate, and quality care. I value your feedback. If you receive a survey regarding your visit,  I greatly appreciate you taking time to fill this out.   Annitta Needs, PhD, ANP-BC [facility] Gastroenterology

## 2022-01-10 ENCOUNTER — Encounter: Payer: Self-pay | Admitting: Gastroenterology

## 2022-01-10 ENCOUNTER — Ambulatory Visit (INDEPENDENT_AMBULATORY_CARE_PROVIDER_SITE_OTHER): Payer: Medicare Other | Admitting: Gastroenterology

## 2022-01-10 VITALS — BP 130/76 | HR 90 | Temp 98.6°F | Ht 61.0 in | Wt 184.4 lb

## 2022-01-10 DIAGNOSIS — K642 Third degree hemorrhoids: Secondary | ICD-10-CM

## 2022-01-10 NOTE — Patient Instructions (Signed)
We are referring you to [facility].  Continue using the cream as you are doing.  Have a wonderful holiday season!  I enjoyed seeing you again today! As you know, I value our relationship and want to provide genuine, compassionate, and quality care. I welcome your feedback. If you receive a survey regarding your visit,  I greatly appreciate you taking time to fill this out. See you next time!  Annitta Needs, PhD, ANP-BC [facility] Gastroenterology

## 2022-01-10 NOTE — Progress Notes (Addendum)
Gastroenterology Office Note     Primary Care Physician:  Janine Limbo, PA-C  Primary Gastroenterologist: Dr. Jenetta Downer   Chief Complaint   Chief Complaint  Patient presents with   Follow-up    Follow up on cream used     History of Present Illness   [name] is a 66 y.o. female presenting today in follow-up with a history of diabetes, diabetic radiculopathy, hypertension, hyperlipidemia, PE, rheumatoid arthritis, IDA, iwith flare of hemorrhoids in Sept 2023. Returning today for close follow-up.  She was recently seen about a month ago with symptomatic hemorrhoids. She has been using a compounded cream since September (hydrocortisone,lidocaine, pramoxine, phenylephrine). Initially, she was dealing with Grade 3/4 hemorrhoids, notable external hemorrhoids and prolapsing Grade 3/4 internal hemorrhoids. At last visit, she had reduced swelling but remained prolapsing and not amenable to hemorrhoid banding in office. She wanted to avoid surgery if at all possible. We decided to wait a few weeks and continue with supportive measures, with hopeful banding if external hemorrhoids improved and dealing with Grade 3 internal hemorrhoids.   She returns today with persistent swelling, discomfort, no significant improvement from last visit but has improved from when initially seen.   BM usually once per day. No straining. No prolonged toilet time. Continues with the cream. Continues with sitz baths.   Past Medical History:  Diagnosis Date   Bradycardia    Diabetes ([place])    Diabetic radiculopathy ([place]) 04/30/2017   High blood pressure    High cholesterol    Pulmonary embolism ([facility])    Rheumatoid arthritis ([place]) 10/25/2020    Past Surgical History:  Procedure Laterality Date   ABDOMINAL HYSTERECTOMY     APPENDECTOMY     COLONOSCOPY WITH PROPOFOL N/A 04/17/2021   Procedure: COLONOSCOPY WITH PROPOFOL;  Surgeon: Harvel Quale, MD;  [facility];   Service: Gastroenterology;  Laterality: N/A;  1215, moved to 7:30 per Darius Bump at the office, pt knows new arrival time   ESOPHAGOGASTRODUODENOSCOPY (EGD) WITH PROPOFOL N/A 04/17/2021   Procedure: ESOPHAGOGASTRODUODENOSCOPY (EGD) WITH PROPOFOL;  Surgeon: Harvel Quale, MD;  Location: [facility];  Service: Gastroenterology;  Laterality: N/A;   hemorhoidectomy     IVC FILTER INSERTION N/A 09/12/2016   Procedure: IVC Filter Insertion;  Surgeon: Conrad Omro, MD;  Location: [facility];  Service: Cardiovascular;  Laterality: N/A;   NECK SURGERY     POLYPECTOMY  04/17/2021   Procedure: POLYPECTOMY;  Surgeon: Harvel Quale, MD;  Location: [facility];  Service: Gastroenterology;;   REFRACTIVE SURGERY     TONSILLECTOMY      Current Outpatient Medications  Medication Sig Dispense Refill   acetaminophen (TYLENOL) 500 MG tablet Take 500 mg by mouth as needed.      amitriptyline (ELAVIL) 25 MG tablet Take 25 mg by mouth at bedtime.     amLODipine (NORVASC) 5 MG tablet Take 5 mg by mouth daily.     apixaban (ELIQUIS) 5 MG TABS tablet Take 5 mg by mouth 2 (two) times daily.     docusate sodium (COLACE) 100 MG capsule Take 100 mg by mouth daily.     DULoxetine (CYMBALTA) 60 MG capsule Take 60 mg by mouth every evening.      etanercept (ENBREL) 50 MG/ML injection Inject 50 mg into the skin once a week. Monday     folic acid (FOLVITE) 1 MG tablet Take 1 mg by mouth daily.     HUMALOG  100 UNIT/ML injection 20 Units 3 (three) times daily before meals.     LANTUS SOLOSTAR 100 UNIT/ML Solostar Pen 40-42 Units See admin instructions. Take 40 units in the morning and 40 units in the evening     levothyroxine (SYNTHROID) 100 MCG tablet Take 100 mcg by mouth daily before breakfast.     lidocaine (XYLOCAINE) 5 % ointment SMARTSIG:sparingly Topical Every 6-8 Hours PRN     metFORMIN (GLUCOPHAGE-XR) 500 MG 24 hr tablet Take 1,000 mg by mouth 2 (two) times daily.     MOUNJARO 5  MG/0.5ML Pen Inject 5 mg into the skin every Thursday.     NONFORMULARY OR COMPOUNDED ITEM Compounded cream from [place] [facility] - called in 30 gram tube on 11/08/21 Hydrocortisone 2.5% Lidocaine 3% Pramoxine 1% Phenylephrine 0.25%  Apply rectally up to four times daily as directed     olmesartan-hydrochlorothiazide (BENICAR HCT) 40-25 MG per tablet Take 1 tablet by mouth in the morning.     OVER THE COUNTER MEDICATION Nitroglycerine0.125 % apply rectally daily     pantoprazole (PROTONIX) 40 MG tablet Take 40 mg by mouth 2 (two) times daily.     Red Yeast Rice Extract 600 MG CAPS Take 1,200 mg by mouth daily at 6 (six) AM.     vitamin C (ASCORBIC ACID) 500 MG tablet Take 500 mg by mouth daily.     ezetimibe (ZETIA) 10 MG tablet TAKE ONE TABLET BY MOUTH AT BEDTIME. 30 tablet 2   ferrous gluconate (FERGON) 240 (27 FE) MG tablet Take 1 tablet (240 mg total) by mouth 2 (two) times daily. 180 tablet 1   gabapentin (NEURONTIN) 800 MG tablet Take 1 tablet (800 mg total) by mouth 2 (two) times daily. 180 tablet 1   No current facility-administered medications for this visit.    Allergies as of 01/10/2022   (No Known Allergies)    Family History  Problem Relation Age of Onset   Hypertension Mother    Cancer Mother    Heart Problems Mother    Heart Problems Father    Diabetes Maternal Grandfather     Social History   Socioeconomic History   Marital status: Married    Spouse name: Not on file   Number of children: 2   Years of education: 12   Highest education level: Not on file  Occupational History   Occupation: Unemployed- disabled  Tobacco Use   Smoking status: Former   Smokeless tobacco: Never  Scientific laboratory technician Use: Never used  Substance and Sexual Activity   Alcohol use: No    Alcohol/week: 0.0 standard drinks of alcohol   Drug use: No   Sexual activity: Not on file  Other Topics Concern   Not on file  Social History Narrative   Lives with spouse   Caffeine use:  Coffee every morning (1 cup)   Right handed    Social Determinants of Health   Financial Resource Strain: Not on file  Food Insecurity: Not on file  Transportation Needs: Not on file  Physical Activity: Not on file  Stress: Not on file  Social Connections: Not on file  Intimate Partner Violence: Not on file     Review of Systems   Gen: Denies any fever, chills, fatigue, weight loss, lack of appetite.  CV: Denies chest pain, heart palpitations, peripheral edema, syncope.  Resp: Denies shortness of breath at rest or with exertion. Denies wheezing or cough.  GI: Denies dysphagia or odynophagia. Denies jaundice, hematemesis,  fecal incontinence. GU : Denies urinary burning, urinary frequency, urinary hesitancy MS: Denies joint pain, muscle weakness, cramps, or limitation of movement.  Derm: Denies rash, itching, dry skin Psych: Denies depression, anxiety, memory loss, and confusion Heme: Denies bruising, bleeding, and enlarged lymph nodes.   Physical Exam   BP 130/76   Pulse 90   Temp 98.6 F (37 C)   Ht '5\' 1"'$  (1.549 m)   Wt 184 lb 6.4 oz (83.6 kg)   BMI 34.84 kg/m  General:   Alert and oriented. Pleasant and cooperative. Well-nourished and well-developed.  Head:  Normocephalic and atraumatic. Eyes:  Without icterus Rectal:  large external hemorrhoids, no further prolapsing of internal hemorrhoids but notably swollen with attempted DRE. Discomfort noted. Not a candidate for hemorrhoid banding at this time in light of pain with DRE Msk:  Symmetrical without gross deformities. Normal posture. Extremities:  Without edema. Neurologic:  Alert and  oriented x4;  grossly normal neurologically. Skin:  Intact without significant lesions or rashes. Psych:  Alert and cooperative. Normal mood and affect.   Assessment   [name] is a 66 y.o. female presenting today in follow-up with a history of diabetes, diabetic radiculopathy, hypertension, hyperlipidemia, PE,  rheumatoid arthritis, IDA, and symptomatic hemorrhoids.   Initial flare with hemorrhoids in Sept 2023, with documented Grade 3/4 internal and large external hemorrhoids. When I saw her in October, she had some improvement in internal hemorrhoids but was not an ideal candidate in light of some Grade 4 hemorrhoids. We did discuss attempting banding if able to have improvement in future, as she was wanting to avoid surgery completely at that time.   Returning today without significant improvement since October 2023. She does have resolution of prolapsing internal hemorrhoids on exam, but I am unable to complete a DRE due to discomfort. Markedly large external hemorrhoids but not thrombosed.   We have maximized conservative measures, and she is not an outpatient banding candidate. We will refer her to Colorectal surgeon.   PLAN    Referral to [facility] Continue compounded cream and supportive measures   Annitta Needs, PhD, ANP-BC [facility]   I have reviewed the note and agree with the APP's assessment as described in this progress note  Maylon Peppers, MD Gastroenterology and [facility]

## 2022-01-28 ENCOUNTER — Ambulatory Visit: Payer: Self-pay | Admitting: Surgery

## 2022-01-28 ENCOUNTER — Encounter: Payer: Self-pay | Admitting: Surgery

## 2022-01-30 NOTE — Progress Notes (Addendum)
COVID Vaccine received:  [] No [x] Yes Date of any COVID positive Test in last 90 days: none  PCP - Janine Limbo, PA-C Cardiologist - none  Chest x-ray -  EKG -  04-13-2021 Stress Test -  ECHO -  Cardiac Cath -   PCR screen: [] Ordered & Completed                      []  No Order but Needs PROFEND                      [x]  N/A for this surgery  Surgery Plan:  [x] Ambulatory                            [] Outpatient in bed                            [] Admit  Anesthesia:    [x] General  [] Spinal                           []  Choice []  MAC  Bowel Prep - [] No  [x]  Yes   CCS MOM bowel prep  Pacemaker / ICD device [x] No [] Yes        Device order form faxed [x] No    []  Yes      Faxed to:  Spinal Cord Stimulator:[x] No [] Yes      (Remind patient to bring remote DOS) Other Implants:   History of Sleep Apnea? [x] No [] Yes   CPAP used?- [x] No [] Yes    Does the patient monitor blood sugar? [] No [x] Yes  [] N/A Does patient have a [employer] or Dexacom? [] No [x] Yes   Fasting Blood Sugar Ranges- 100-130 Checks Blood Sugar _continuous  times a day  Patient is taking Met formin, Humalog and Lantus in addition to [place]. Last dose of GLP1 agonist- Mounjaro,  GLP1 instructions: No Injection on DOS - Thursday 02-07-2022, May resume injections on the Thursday after surgery 02-14-22  Last dose of SGLT-2 inhibitors- none SGLT-2 instructions:   Blood Thinner / Instructions:Eliquis   Hold 2 days per Dr. Johney Maine,  Last dose: Monday 02-04-22 per patient's instruction from Dr. Johney Maine Aspirin Instructions: none  ERAS Protocol Ordered: [] No  [x] Yes PRE-SURGERY [] ENSURE  [x] G2  Patient is to be NPO after: 1:30  pm  Comments: Patient has an IVC Filter  Activity level: Patient can not climb a flight of stairs without difficulty; [x] No CP but would have leg pain and SOB. Patient can perform ADLs without assistance.   Anesthesia review: DM2, HTN, Hx PE - IVC filter  placed 09-12-2016, RA, Anemia,   Patient denies shortness of breath, fever, cough and chest pain at PAT appointment.  Patient verbalized understanding and agreement to the Pre-Surgical Instructions that were given to them at this PAT appointment. Patient was also educated of the need to review these PAT instructions again prior to his/her surgery.I reviewed the appropriate phone numbers to call if they have any and questions or concerns.

## 2022-01-30 NOTE — Patient Instructions (Addendum)
SURGICAL WAITING ROOM VISITATION Patients having surgery or a procedure may have no more than 2 support people in the waiting area - these visitors may rotate in the visitor waiting room.   Children under the age of 38 must have an adult with them who is not the patient. If the patient needs to stay at the hospital during part of their recovery, the visitor guidelines for inpatient rooms apply.  PRE-OP VISITATION  Pre-op nurse will coordinate an appropriate time for 1 support person to accompany the patient in pre-op.  This support person may not rotate.  This visitor will be contacted when the time is appropriate for the visitor to come back in the pre-op area.  Please refer to the [facility] website for the visitor guidelines for Inpatients (after your surgery is over and you are in a regular room).  You are not required to quarantine at this time prior to your surgery. However, you must do this: Hand Hygiene often Do NOT share personal items Notify your provider if you are in close contact with someone who has COVID or you develop fever 100.4 or greater, new onset of sneezing, cough, sore throat, shortness of breath or body aches.  If you test positive for Covid or have been in contact with anyone that has tested positive in the last 10 days please notify you surgeon.    Your procedure is scheduled on: Thursday  February 07, 2022   Report to [facility] Main Entrance: [place] entrance where the [employer] is available.   Report to admitting at:  2:15 PM  +++++Call this number if you have any questions or problems the morning of surgery [phone]   These are anesthesia recommendations for holding your anticoagulants.  Please contact your prescribing physician to confirm IF it is safe to hold your anticoagulants for this length of time  Eliquis Apixaban   72 hours  ????  36 hours        How to Manage Your Diabetes Before and After Surgery  Why is it important to  control my blood sugar before and after surgery? Improving blood sugar levels before and after surgery helps healing and can limit problems. A way of improving blood sugar control is eating a healthy diet by:  Eating less sugar and carbohydrates  Increasing activity/exercise  Talking with your doctor about reaching your blood sugar goals High blood sugars (greater than 180 mg/dL) can raise your risk of infections and slow your recovery, so you will need to focus on controlling your diabetes during the weeks before surgery. Make sure that the doctor who takes care of your diabetes knows about your planned surgery including the date and location.  How do I manage my blood sugar before surgery? Check your blood sugar at least 4 times a day, starting 2 days before surgery, to make sure that the level is not too high or low. Check your blood sugar the morning of your surgery when you wake up and every 2 hours until you get to the Short Stay unit. If your blood sugar is less than 70 mg/dL, you will need to treat for low blood sugar: Do not take insulin. Treat a low blood sugar (less than 70 mg/dL) with  cup of clear juice (cranberry or apple), 4 glucose tablets, OR glucose gel. Recheck blood sugar in 15 minutes after treatment (to make sure it is greater than 70 mg/dL). If your blood sugar is not greater than 70 mg/dL on  recheck, call [phone] for further instructions. Report your blood sugar to the short stay nurse when you get to Short Stay.  If you are admitted to the hospital after surgery: Your blood sugar will be checked by the staff and you will probably be given insulin after surgery (instead of oral diabetes medicines) to make sure you have good blood sugar levels. The goal for blood sugar control after surgery is 80-180 mg/dL.   WHAT DO I DO ABOUT MY DIABETES MEDICATION?       Metformin-  Take your usual dose the DAY BEFORE surgery but DO NOT take it the Day of your surgery.          Humalog-  Day Before surgery: Take your usual dose (20 Units) with meals.                            Day of Surgery: If your CBG is greater than 220 mg/dL, you may take  of your sliding scale (correction) dose of insulin.   Lantus-    Day Before surgery: Take usual dose in the morning and take 50% (20 units) in the evening dose.                     Day of Surgery: Take 50% dose (20-21 units)   Mounjaro - you may inject on THursday 01-31-22. DO NOT inject on Thursday 02-07-22 -(Day of surgery). You may resume injections the week after your surgery (02-14-22)  IF you have any questions, call the nurse at [place]!!!  Obtain a bottle of Milk of Magnesia at a pharmacy  DAY PRIOR TO SURGERY:  Switch to drinking liquids or pureed foods only Drink plenty of liquids all day to avoid getting dehydrated  10:00am: Take 2 oz (4 tablespoons) Milk of Magnesia. 2:00pm: Take 2 oz (4 tablespoons) Milk of Magnesia.  Midnight: Do not eat anything solid after bedtime (midnight) the night before your surgery. BUT DO drink plenty of clear liquids (Water, Gatorade, juice, soda, coffee, tea, broths, etc.)   After Midnight you may have the following liquids until  1:30 PM DAY OF SURGERY  Clear Liquid Diet Water Black Coffee (sugar ok, NO MILK/CREAM OR CREAMERS)  Tea (sugar ok, NO MILK/CREAM OR CREAMERS) regular and decaf                             Plain Jell-O  with no fruit (NO RED)                                           Fruit ices (not with fruit pulp, NO RED)                                     Popsicles (NO RED)  Juice: apple, WHITE grape, WHITE cranberry Sports drinks like Gatorade or Powerade (NO RED)                    The day of surgery:  Drink ONE (1) Pre-Surgery  G2 at 1:30 PM the morning of surgery. Drink in one  sitting. Do not sip.  This drink was given to you during your hospital pre-op appointment visit. Nothing else to drink after completing the Pre-Surgery  G2 : No candy, chewing gum or throat lozenges.      Oral Hygiene is also important to reduce your risk of infection.        Remember - BRUSH YOUR TEETH THE MORNING OF SURGERY WITH YOUR REGULAR TOOTHPASTE   Take ONLY these medicines the morning of surgery with A SIP OF WATER: Levothyroxine (Synthroid), Pantoprazole (Protonix), Gabapentin, amlodipine You may take Tylenol if needed for pain.                  You may not have any metal on your body including hair pins, jewelry, and body piercing  Do not wear make-up, lotions, powders, perfumes , or deodorant  Do not wear nail polish including gel and S&S, artificial / acrylic nails, or any other type of covering on natural nails including finger and toenails. If you have artificial nails, gel coating, etc., that needs to be removed by a nail salon, Please have this removed prior to surgery. Not doing so may mean that your surgery could be cancelled or delayed if the Surgeon or anesthesia staff feels like they are unable to monitor you safely.   Do not shave 48 hours prior to surgery to avoid nicks in your skin which may contribute to postoperative infections.   Contacts, Hearing Aids, dentures or bridgework may not be worn into surgery. DENTURES WILL BE REMOVED PRIOR TO SURGERY PLEASE DO NOT APPLY "Poly grip" OR ADHESIVES!!!  Patients discharged on the day of surgery will not be allowed to drive home.  Someone NEEDS to stay with you for the first 24 hours after anesthesia.  Do not bring your home medications to the hospital. The Pharmacy will dispense medications listed on your medication list to you during your admission in the Hospital.  Special Instructions: Bring a copy of your healthcare power of attorney and living will documents the day of surgery, if you wish to have them scanned into  your [facility] Records- EPIC  Please read over the following fact sheets you were given: IF YOU HAVE QUESTIONS ABOUT YOUR PRE-OP INSTRUCTIONS, PLEASE CALL [phone]  ([place])   [facility] - Preparing for Surgery Before surgery, you can play an important role.  Because skin is not sterile, your skin needs to be as free of germs as possible.  You can reduce the number of germs on your skin by washing with CHG (chlorahexidine gluconate) soap before surgery.  CHG is an antiseptic cleaner which kills germs and bonds with the skin to continue killing germs even after washing. Please DO NOT use if you have an allergy to CHG or antibacterial soaps.  If your skin becomes reddened/irritated stop using the CHG and inform your nurse when you arrive at Short Stay. Do not shave (including legs and underarms) for at least 48 hours prior to the first CHG shower.  You may shave your face/neck.  Please follow these instructions carefully:  1.  Shower with CHG Soap the night before surgery and the  morning of surgery.  2.  If you choose to wash your hair, wash your hair first as usual with your normal  shampoo.  3.  After you shampoo, rinse your hair and body thoroughly to remove the shampoo.                             4.  Use CHG as you would any other liquid soap.  You can apply chg directly to the skin and wash.  Gently with a scrungie or clean washcloth.  5.  Apply the CHG Soap to your body ONLY FROM THE NECK DOWN.   Do not use on face/ open                           Wound or open sores. Avoid contact with eyes, ears mouth and genitals (private parts).                       Wash face,  Genitals (private parts) with your normal soap.             6.  Wash thoroughly, paying special attention to the area where your  surgery  will be performed.  7.  Thoroughly rinse your body with warm water from the neck down.  8.  DO NOT shower/wash with your normal soap after using and rinsing off the CHG Soap.             9.  Pat yourself dry with a clean towel.            10.  Wear clean pajamas.            11.  Place clean sheets on your bed the night of your first shower and do not  sleep with pets.  ON THE DAY OF SURGERY : Do not apply any lotions/deodorants the morning of surgery.  Please wear clean clothes to the hospital/surgery center.    FAILURE TO FOLLOW THESE INSTRUCTIONS MAY RESULT IN THE CANCELLATION OF YOUR SURGERY  PATIENT SIGNATURE_________________________________  NURSE SIGNATURE__________________________________  ________________________________________________________________________

## 2022-02-01 ENCOUNTER — Encounter (HOSPITAL_COMMUNITY)
Admission: RE | Admit: 2022-02-01 | Discharge: 2022-02-01 | Disposition: A | Discharge status: Home / Self Care | Payer: Medicare Other | Source: Ambulatory Visit | Attending: Surgery | Admitting: Surgery

## 2022-02-01 ENCOUNTER — Encounter (HOSPITAL_COMMUNITY): Payer: Self-pay

## 2022-02-01 ENCOUNTER — Other Ambulatory Visit: Payer: Self-pay

## 2022-02-01 VITALS — BP 130/71 | HR 90 | Temp 98.9°F | Resp 20 | Ht 61.0 in | Wt 182.0 lb

## 2022-02-01 DIAGNOSIS — Z01812 Encounter for preprocedural laboratory examination: Secondary | ICD-10-CM | POA: Diagnosis present

## 2022-02-01 DIAGNOSIS — I10 Essential (primary) hypertension: Secondary | ICD-10-CM | POA: Diagnosis not present

## 2022-02-01 DIAGNOSIS — Z794 Long term (current) use of insulin: Secondary | ICD-10-CM | POA: Insufficient documentation

## 2022-02-01 DIAGNOSIS — E119 Type 2 diabetes mellitus without complications: Secondary | ICD-10-CM | POA: Diagnosis not present

## 2022-02-01 HISTORY — DX: Pneumonia, unspecified organism: J18.9

## 2022-02-01 HISTORY — DX: Hypothyroidism, unspecified: E03.9

## 2022-02-01 HISTORY — DX: Anemia, unspecified: D64.9

## 2022-02-01 LAB — BASIC METABOLIC PANEL
Anion gap: 11 (ref 5–15)
BUN: 16 mg/dL (ref 8–23)
CO2: 25 mmol/L (ref 22–32)
Calcium: 9.6 mg/dL (ref 8.9–10.3)
Chloride: 97 mmol/L — ABNORMAL LOW (ref 98–111)
Creatinine, Ser: 1.22 mg/dL — ABNORMAL HIGH (ref 0.44–1.00)
GFR, Estimated: 50 mL/min — ABNORMAL LOW (ref >60)
Glucose, Bld: 140 mg/dL — ABNORMAL HIGH (ref 70–99)
Potassium: 4.2 mmol/L (ref 3.5–5.1)
Sodium: 133 mmol/L — ABNORMAL LOW (ref 135–145)

## 2022-02-01 LAB — CBC
HCT: 36.7 % (ref 36.0–46.0)
Hemoglobin: 12 g/dL (ref 12.0–15.0)
MCH: 33.1 pg (ref 26.0–34.0)
MCHC: 32.7 g/dL (ref 30.0–36.0)
MCV: 101.1 fL — ABNORMAL HIGH (ref 80.0–100.0)
Platelets: 218 10*3/uL (ref 150–400)
RBC: 3.63 MIL/uL — ABNORMAL LOW (ref 3.87–5.11)
RDW: 14.1 % (ref 11.5–15.5)
WBC: 9.8 10*3/uL (ref 4.0–10.5)
nRBC: 0 % (ref 0.0–0.2)

## 2022-02-02 LAB — HEMOGLOBIN A1C
Hgb A1c MFr Bld: 6.7 % — ABNORMAL HIGH (ref 4.8–5.6)
Mean Plasma Glucose: 146 mg/dL

## 2022-02-04 ENCOUNTER — Other Ambulatory Visit: Payer: Self-pay | Admitting: Neurology

## 2022-02-07 ENCOUNTER — Encounter (HOSPITAL_COMMUNITY)
Admission: RE | Disposition: A | Discharge status: Home / Self Care | Payer: Self-pay | Source: Ambulatory Visit | Attending: Surgery

## 2022-02-07 ENCOUNTER — Ambulatory Visit (HOSPITAL_COMMUNITY)
Admission: RE | Admit: 2022-02-07 | Discharge: 2022-02-07 | Disposition: A | Discharge status: Home / Self Care | Payer: Medicare Other | Source: Ambulatory Visit | Attending: Surgery | Admitting: Surgery

## 2022-02-07 ENCOUNTER — Other Ambulatory Visit: Payer: Self-pay

## 2022-02-07 ENCOUNTER — Ambulatory Visit (HOSPITAL_BASED_OUTPATIENT_CLINIC_OR_DEPARTMENT_OTHER): Payer: Medicare Other | Admitting: Registered Nurse

## 2022-02-07 ENCOUNTER — Ambulatory Visit (HOSPITAL_COMMUNITY): Payer: Medicare Other | Admitting: Physician Assistant

## 2022-02-07 DIAGNOSIS — Z794 Long term (current) use of insulin: Secondary | ICD-10-CM

## 2022-02-07 DIAGNOSIS — Z7984 Long term (current) use of oral hypoglycemic drugs: Secondary | ICD-10-CM | POA: Diagnosis not present

## 2022-02-07 DIAGNOSIS — I10 Essential (primary) hypertension: Secondary | ICD-10-CM

## 2022-02-07 DIAGNOSIS — K643 Fourth degree hemorrhoids: Secondary | ICD-10-CM | POA: Diagnosis not present

## 2022-02-07 DIAGNOSIS — Z87891 Personal history of nicotine dependence: Secondary | ICD-10-CM | POA: Insufficient documentation

## 2022-02-07 DIAGNOSIS — K644 Residual hemorrhoidal skin tags: Secondary | ICD-10-CM | POA: Insufficient documentation

## 2022-02-07 DIAGNOSIS — D649 Anemia, unspecified: Secondary | ICD-10-CM | POA: Insufficient documentation

## 2022-02-07 DIAGNOSIS — Z7901 Long term (current) use of anticoagulants: Secondary | ICD-10-CM | POA: Insufficient documentation

## 2022-02-07 DIAGNOSIS — N843 Polyp of vulva: Secondary | ICD-10-CM | POA: Diagnosis not present

## 2022-02-07 DIAGNOSIS — K219 Gastro-esophageal reflux disease without esophagitis: Secondary | ICD-10-CM | POA: Insufficient documentation

## 2022-02-07 DIAGNOSIS — K625 Hemorrhage of anus and rectum: Secondary | ICD-10-CM | POA: Insufficient documentation

## 2022-02-07 DIAGNOSIS — E039 Hypothyroidism, unspecified: Secondary | ICD-10-CM | POA: Insufficient documentation

## 2022-02-07 DIAGNOSIS — E1140 Type 2 diabetes mellitus with diabetic neuropathy, unspecified: Secondary | ICD-10-CM | POA: Insufficient documentation

## 2022-02-07 DIAGNOSIS — E119 Type 2 diabetes mellitus without complications: Secondary | ICD-10-CM | POA: Diagnosis not present

## 2022-02-07 DIAGNOSIS — M069 Rheumatoid arthritis, unspecified: Secondary | ICD-10-CM | POA: Diagnosis not present

## 2022-02-07 HISTORY — PX: EVALUATION UNDER ANESTHESIA WITH HEMORRHOIDECTOMY: SHX5624

## 2022-02-07 LAB — GLUCOSE, CAPILLARY
Glucose-Capillary: 128 mg/dL — ABNORMAL HIGH (ref 70–99)
Glucose-Capillary: 94 mg/dL (ref 70–99)

## 2022-02-07 SURGERY — EXAM UNDER ANESTHESIA WITH HEMORRHOIDECTOMY
Anesthesia: General

## 2022-02-07 MED ORDER — BUPIVACAINE LIPOSOME 1.3 % IJ SUSP: 20.0000 mL | Freq: Once | INTRAMUSCULAR | Status: DC

## 2022-02-07 MED ORDER — DIAZEPAM 5 MG PO TABS: 5.0000 mg | ORAL_TABLET | Freq: Three times a day (TID) | ORAL | 0 refills | Status: AC | PRN

## 2022-02-07 MED ORDER — PROPOFOL 10 MG/ML IV BOLUS
INTRAVENOUS | Status: AC
  Filled 2022-02-07: qty 20

## 2022-02-07 MED ORDER — FENTANYL CITRATE PF 50 MCG/ML IJ SOSY
PREFILLED_SYRINGE | INTRAMUSCULAR | Status: AC
  Filled 2022-02-07: qty 2

## 2022-02-07 MED ORDER — OXYCODONE HCL 5 MG PO TABS: 5.0000 mg | ORAL_TABLET | Freq: Four times a day (QID) | ORAL | 0 refills | Status: AC | PRN

## 2022-02-07 MED ORDER — LIDOCAINE 2% (20 MG/ML) 5 ML SYRINGE
INTRAMUSCULAR | Status: DC | PRN
  Administered 2022-02-07: 40 mg via INTRAVENOUS
  Administered 2022-02-07: 60 mg via INTRAVENOUS

## 2022-02-07 MED ORDER — DEXAMETHASONE SODIUM PHOSPHATE 10 MG/ML IJ SOLN
INTRAMUSCULAR | Status: DC | PRN
  Administered 2022-02-07: 4 mg via INTRAVENOUS

## 2022-02-07 MED ORDER — SUGAMMADEX SODIUM 200 MG/2ML IV SOLN
INTRAVENOUS | Status: DC | PRN
  Administered 2022-02-07: 200 mg via INTRAVENOUS

## 2022-02-07 MED ORDER — ONDANSETRON HCL 4 MG/2ML IJ SOLN
INTRAMUSCULAR | Status: DC | PRN
  Administered 2022-02-07: 4 mg via INTRAVENOUS

## 2022-02-07 MED ORDER — LACTATED RINGERS IV SOLN: INTRAVENOUS | Status: DC

## 2022-02-07 MED ORDER — ORAL CARE MOUTH RINSE: 15.0000 mL | Freq: Once | OROMUCOSAL | Status: AC

## 2022-02-07 MED ORDER — DIBUCAINE (PERIANAL) 1 % EX OINT
TOPICAL_OINTMENT | CUTANEOUS | Status: AC
  Filled 2022-02-07: qty 28

## 2022-02-07 MED ORDER — FENTANYL CITRATE PF 50 MCG/ML IJ SOSY: 25.0000 ug | PREFILLED_SYRINGE | INTRAMUSCULAR | Status: DC | PRN

## 2022-02-07 MED ORDER — ENSURE PRE-SURGERY PO LIQD: 296.0000 mL | Freq: Once | ORAL | Status: DC

## 2022-02-07 MED ORDER — METHYLENE BLUE 1 % INJ SOLN
INTRAVENOUS | Status: AC
  Filled 2022-02-07: qty 10

## 2022-02-07 MED ORDER — GABAPENTIN 300 MG PO CAPS
300.0000 mg | ORAL_CAPSULE | ORAL | Status: DC
  Filled 2022-02-07: qty 1

## 2022-02-07 MED ORDER — PHENYLEPHRINE 80 MCG/ML (10ML) SYRINGE FOR IV PUSH (FOR BLOOD PRESSURE SUPPORT)
PREFILLED_SYRINGE | INTRAVENOUS | Status: DC | PRN
  Administered 2022-02-07 (×4): 120 ug via INTRAVENOUS

## 2022-02-07 MED ORDER — SODIUM CHLORIDE 0.9 % IV SOLN: INTRAVENOUS | Status: DC

## 2022-02-07 MED ORDER — CHLORHEXIDINE GLUCONATE 0.12 % MT SOLN
15.0000 mL | Freq: Once | OROMUCOSAL | Status: AC
  Administered 2022-02-07: 15 mL via OROMUCOSAL

## 2022-02-07 MED ORDER — OXYCODONE HCL 5 MG PO TABS
ORAL_TABLET | ORAL | Status: AC
  Filled 2022-02-07: qty 1

## 2022-02-07 MED ORDER — BUPIVACAINE LIPOSOME 1.3 % IJ SUSP
INTRAMUSCULAR | Status: AC
  Filled 2022-02-07: qty 20

## 2022-02-07 MED ORDER — PROPOFOL 10 MG/ML IV BOLUS
INTRAVENOUS | Status: DC | PRN
  Administered 2022-02-07: 130 mg via INTRAVENOUS
  Administered 2022-02-07: 30 mg via INTRAVENOUS

## 2022-02-07 MED ORDER — OXYCODONE HCL 5 MG PO TABS
5.0000 mg | ORAL_TABLET | Freq: Once | ORAL | Status: AC | PRN
  Administered 2022-02-07: 5 mg via ORAL

## 2022-02-07 MED ORDER — DIAZEPAM 5 MG PO TABS: 5.0000 mg | ORAL_TABLET | Freq: Three times a day (TID) | ORAL | 1 refills | Status: AC | PRN

## 2022-02-07 MED ORDER — DIBUCAINE (PERIANAL) 1 % EX OINT
TOPICAL_OINTMENT | CUTANEOUS | Status: DC | PRN
  Administered 2022-02-07: 1 via RECTAL

## 2022-02-07 MED ORDER — BUPIVACAINE LIPOSOME 1.3 % IJ SUSP
INTRAMUSCULAR | Status: DC | PRN
  Administered 2022-02-07: 20 mL

## 2022-02-07 MED ORDER — FENTANYL CITRATE PF 50 MCG/ML IJ SOSY
25.0000 ug | PREFILLED_SYRINGE | INTRAMUSCULAR | Status: DC | PRN
  Administered 2022-02-07 (×2): 50 ug via INTRAVENOUS

## 2022-02-07 MED ORDER — DEXAMETHASONE SODIUM PHOSPHATE 10 MG/ML IJ SOLN
INTRAMUSCULAR | Status: AC
  Filled 2022-02-07: qty 1

## 2022-02-07 MED ORDER — CHLORHEXIDINE GLUCONATE CLOTH 2 % EX PADS: 6.0000 | MEDICATED_PAD | Freq: Once | CUTANEOUS | Status: DC

## 2022-02-07 MED ORDER — CELECOXIB 200 MG PO CAPS
200.0000 mg | ORAL_CAPSULE | ORAL | Status: AC
  Administered 2022-02-07: 200 mg via ORAL
  Filled 2022-02-07: qty 1

## 2022-02-07 MED ORDER — ONDANSETRON HCL 4 MG/2ML IJ SOLN: 4.0000 mg | Freq: Once | INTRAMUSCULAR | Status: DC | PRN

## 2022-02-07 MED ORDER — FENTANYL CITRATE PF 50 MCG/ML IJ SOSY
PREFILLED_SYRINGE | INTRAMUSCULAR | Status: AC
  Filled 2022-02-07: qty 1

## 2022-02-07 MED ORDER — AMISULPRIDE (ANTIEMETIC) 5 MG/2ML IV SOLN: 10.0000 mg | Freq: Once | INTRAVENOUS | Status: DC | PRN

## 2022-02-07 MED ORDER — LIDOCAINE HCL (PF) 2 % IJ SOLN
INTRAMUSCULAR | Status: AC
  Filled 2022-02-07: qty 5

## 2022-02-07 MED ORDER — ONDANSETRON HCL 4 MG/2ML IJ SOLN
INTRAMUSCULAR | Status: AC
  Filled 2022-02-07: qty 2

## 2022-02-07 MED ORDER — OXYCODONE HCL 5 MG/5ML PO SOLN: 5.0000 mg | Freq: Once | ORAL | Status: AC | PRN

## 2022-02-07 MED ORDER — BUPIVACAINE HCL (PF) 0.5 % IJ SOLN
INTRAMUSCULAR | Status: AC
  Filled 2022-02-07: qty 30

## 2022-02-07 MED ORDER — ROCURONIUM BROMIDE 10 MG/ML (PF) SYRINGE
PREFILLED_SYRINGE | INTRAVENOUS | Status: DC | PRN
  Administered 2022-02-07: 60 mg via INTRAVENOUS
  Administered 2022-02-07: 10 mg via INTRAVENOUS

## 2022-02-07 MED ORDER — BUPIVACAINE HCL (PF) 0.5 % IJ SOLN
INTRAMUSCULAR | Status: DC | PRN
  Administered 2022-02-07: 30 mL

## 2022-02-07 MED ORDER — ACETAMINOPHEN 500 MG PO TABS
1000.0000 mg | ORAL_TABLET | ORAL | Status: AC
  Administered 2022-02-07: 1000 mg via ORAL
  Filled 2022-02-07: qty 2

## 2022-02-07 MED ORDER — LIDOCAINE-EPINEPHRINE 1 %-1:100000 IJ SOLN
INTRAMUSCULAR | Status: AC
  Filled 2022-02-07: qty 2

## 2022-02-07 MED ORDER — 0.9 % SODIUM CHLORIDE (POUR BTL) OPTIME
TOPICAL | Status: DC | PRN
  Administered 2022-02-07: 1000 mL

## 2022-02-07 MED ORDER — ROCURONIUM BROMIDE 10 MG/ML (PF) SYRINGE
PREFILLED_SYRINGE | INTRAVENOUS | Status: AC
  Filled 2022-02-07: qty 10

## 2022-02-07 MED ORDER — SODIUM CHLORIDE 0.9 % IV SOLN
2.0000 g | INTRAVENOUS | Status: AC
  Administered 2022-02-07: 2 g via INTRAVENOUS
  Filled 2022-02-07: qty 20

## 2022-02-07 SURGICAL SUPPLY — 36 items
BAG COUNTER SPONGE SURGICOUNT (BAG) IMPLANT
BENZOIN TINCTURE PRP APPL 2/3 (GAUZE/BANDAGES/DRESSINGS) ×1 IMPLANT
BLADE SURG 15 STRL LF DISP TIS (BLADE) IMPLANT
BLADE SURG 15 STRL SS (BLADE)
BRIEF MESH DISP LRG (UNDERPADS AND DIAPERS) ×1 IMPLANT
CNTNR URN SCR LID CUP LEK RST (MISCELLANEOUS) ×1 IMPLANT
CONT SPEC 4OZ STRL OR WHT (MISCELLANEOUS) ×1
COVER SURGICAL LIGHT HANDLE (MISCELLANEOUS) ×1 IMPLANT
DRAPE LAPAROTOMY T 102X78X121 (DRAPES) ×1 IMPLANT
ELECT REM PT RETURN 15FT ADLT (MISCELLANEOUS) ×1 IMPLANT
GAUZE 4X4 16PLY ~~LOC~~+RFID DBL (SPONGE) ×1 IMPLANT
GAUZE PAD ABD 8X10 STRL (GAUZE/BANDAGES/DRESSINGS) IMPLANT
GAUZE SPONGE 4X4 12PLY STRL (GAUZE/BANDAGES/DRESSINGS) IMPLANT
GLOVE ECLIPSE 8.0 STRL XLNG CF (GLOVE) ×1 IMPLANT
GLOVE INDICATOR 8.0 STRL GRN (GLOVE) ×1 IMPLANT
GOWN STRL REUS W/ TWL XL LVL3 (GOWN DISPOSABLE) ×3 IMPLANT
GOWN STRL REUS W/TWL XL LVL3 (GOWN DISPOSABLE) ×3
KIT BASIN OR (CUSTOM PROCEDURE TRAY) ×1 IMPLANT
KIT TURNOVER KIT A (KITS) IMPLANT
LOOP VESSEL MAXI BLUE (MISCELLANEOUS) IMPLANT
NEEDLE HYPO 22GX1.5 SAFETY (NEEDLE) ×1 IMPLANT
PACK BASIC VI WITH GOWN DISP (CUSTOM PROCEDURE TRAY) ×1 IMPLANT
PENCIL SMOKE EVACUATOR (MISCELLANEOUS) IMPLANT
SHEARS HARMONIC 9CM CVD (BLADE) IMPLANT
SPIKE FLUID TRANSFER (MISCELLANEOUS) ×1 IMPLANT
SURGILUBE 2OZ TUBE FLIPTOP (MISCELLANEOUS) ×1 IMPLANT
SUT CHROMIC 2 0 SH (SUTURE) ×1 IMPLANT
SUT CHROMIC 3 0 SH 27 (SUTURE) IMPLANT
SUT VIC AB 2-0 SH 27 (SUTURE)
SUT VIC AB 2-0 SH 27X BRD (SUTURE) IMPLANT
SUT VIC AB 2-0 UR6 27 (SUTURE) ×6 IMPLANT
SYR 20ML LL LF (SYRINGE) ×1 IMPLANT
SYR 3ML LL SCALE MARK (SYRINGE) IMPLANT
TOWEL OR 17X26 10 PK STRL BLUE (TOWEL DISPOSABLE) ×1 IMPLANT
TOWEL OR NON WOVEN STRL DISP B (DISPOSABLE) ×1 IMPLANT
YANKAUER SUCT BULB TIP 10FT TU (MISCELLANEOUS) ×1 IMPLANT

## 2022-02-07 NOTE — Op Note (Signed)
02/07/2022  4:28 PM  PATIENT:  [name]  66 y.o. female  Patient Care Team: Janine Limbo, PA-C as PCP - General Valinda Party, MD as Consulting Physician (Rheumatology) Eloise Harman, DO as Consulting Physician (Gastroenterology) Marcial Pacas, MD as Consulting Physician (Neurology) Montez Morita, Quillian Quince, MD as Consulting Physician (Gastroenterology) Michael Boston, MD as Consulting Physician (Colon and Rectal Surgery)  PRE-OPERATIVE DIAGNOSIS:  HEMORRHOIDS PROLAPSED GRADE 4 WITH BLEEDING AND PAIN  POST-OPERATIVE DIAGNOSIS:   HEMORRHOIDS PROLAPSED GRADE 4 WITH BLEEDING AND PAIN INFLAMED LABIAL POLYP  PROCEDURE:   Internal and external hemorrhoidectomy x2 External hemorrhoidectomy x2 Excisional labial polyp x1 Internal hemorrhoidal ligation and pexy Anorectal examination under anesthesia  SURGEON:  Adin Hector, MD  ANESTHESIA:   General Anorectal & Local field block (0.25% bupivacaine with epinephrine mixed with Liposomal bupivacaine (Experel)   EBL:  Total I/O In: 100 [IV Piggyback:100] Out: - .  See operative record  Delay start of Pharmacological VTE agent (>24hrs) due to surgical blood loss or risk of bleeding:  NO  DRAINS: NONE  SPECIMEN:   Internal & external hemorrhoid x2 External hemorrhoid x2 Labial polyp   DISPOSITION OF SPECIMEN:  PATHOLOGY  COUNTS:  YES  PLAN OF CARE: Discharge home after PACU  PATIENT DISPOSITION:  PACU - hemodynamically stable.  INDICATION: Pleasant patient with struggles with hemorrhoids.  Not able to be managed in the office despite an improved bowel regimen.  I recommended examination under anesthesia and surgical treatment:  The anatomy & physiology of the anorectal region was discussed.  The pathophysiology of hemorrhoids and differential diagnosis was discussed.  Natural history risks without surgery was discussed.   I stressed the importance of a bowel regimen to have daily soft bowel movements  to minimize progression of disease.  Interventions such as sclerotherapy & banding were discussed.  The patient's symptoms are not adequately controlled by medicines and other non-operative treatments.  I feel the risks & problems of no surgery outweigh the operative risks; therefore, I recommended surgery to treat the hemorrhoids by ligation, pexy, and possible resection.  Risks such as bleeding, infection, need for further treatment, heart attack, death, and other risks were discussed.   I noted a good likelihood this will help address the problem.  Goals of post-operative recovery were discussed as well.  Possibility that this will not correct all symptoms was explained.  Post-operative pain, bleeding, constipation, urinary difficulties, and other problems after surgery were discussed.  We will work to minimize complications.   Educational handouts further explaining the pathology, treatment options, and bowel regimen were given as well.  Questions were answered.  The patient expresses understanding & wishes to proceed with surgery.  OR FINDINGS: Redundant floppy rectum.  Very large right anterior grade 4 hemorrhoid with significant external tags.  Left lateral right posterior and right lateral small hemorrhoidal tissue.  Right posterior labial polypoid fibrotic mass - excised  DESCRIPTION:   Informed consent was confirmed. Patient underwent general anesthesia without difficulty. Patient was placed into  prone/jacknife positioning.  The perianal region was prepped and draped in sterile fashion. Surgical time-out confirmed our plan.  I did digital rectal examination and then transitioned over to anoscopy to get a sense of the anatomy.  Findings noted above.   I proceeded to do hemorrhoidal ligation and pexy using a large self-retaining Parks retractor.  Occasionally alternating with a large Training and development officer.  I used a 2-0 Vicryl suture on a UR-6 needle in a figure-of-eight  fashion 6 cm  proximal to the anal verge.  I started at the largest hemorrhoid pile, right anterior.  Because of redundant hemorrhoidal tissue too bulky to merely ligate or pexy, I excised the excess internal hemorrhoid piles longitudinally in a fusiform biconcave fashion, sparing the anal canal to avoid narrowing.  I then ran that stitch longitudinally more distally to close the hemorrhoidectomy wound to the anal verge over a large Parks self-retaining anal retractor to avoid narrowing of the anal canal.  I then tied that stitch down to cause a hemorrhoidopexy.   I also had to do an excision at the left lateral anal crypt polyp.  Right posterior external hemorrhoidal tag.  Right lateral external hemorrhoidal tag. I ultimately did hemorrhoidal ligation and pexy at the other  hemorrhoidal columns.  At the completion of this, all 6 anorectal columns were ligated and pexied in the classic hexagonal fashion (right anterior/lateral/posterior, left anterior/lateral/posterior).  Additional figure-of-eight ligation the right anterior pile for hemostasis.  I redid anoscopy and examination.  Hemostasis was good.  I radially trimmed and closed closed the external part of the hemorrhoidectomy wounds with radial interrupted horizontal mattress 2-0 chromic suture over a large Sawyer anal retractor, leaving the last 5 mm open to allow natural drainage.  Patient had a persistent inflamed tag of skin and questionable granulation tissue on the right posterior labia that seemed separate but most likely secondary inflamed from the giant right anterior hemorrhoid.  I ended up excising the redundant skin radially antibiotic and Calube ellipsoid fashion along the right posterior labia towards the midline raphae.  Closed that with deep dermal 2-0 Vicryl suture in interrupted horizontal mattress 2-0 chromic suture.  I repeated anoscopy & examination.  At completion of this, all hemorrhoids had been removed or reduced into the rectum.  There is no more  prolapse.  Internal & external anatomy was more more normal.  Hemostasis was good.  Fluffed gauze was on-laid over the perianal region.  No packing done.  Patient is being extubated go to go to the recovery room.  I had discussed postop care in detail with the patient in the preop holding area.  Instructions for post-operative recovery and prescriptions are written. I discussed operative findings, updated the patient's status, discussed probable steps to recovery, and gave postoperative recommendations to the patient's spouse, [name] .  Recommendations were made.  Questions were answered.  She expressed understanding & appreciation.  Adin Hector, M.D., F.A.C.S. Gastrointestinal and Minimally Invasive Surgery [facility] [address] [place], [place] [address] [phone] Main / Paging

## 2022-02-07 NOTE — Anesthesia Preprocedure Evaluation (Addendum)
Anesthesia Evaluation  Patient identified by MRN, date of birth, ID band Patient awake    Reviewed: Allergy & Precautions, NPO status , Patient's Chart, lab work & pertinent test results  History of Anesthesia Complications Negative for: history of anesthetic complications  Airway Mallampati: III  TM Distance: >3 FB Neck ROM: Full    Dental  (+) Dental Advisory Given   Pulmonary former smoker   breath sounds clear to auscultation       Cardiovascular hypertension, Pt. on medications  Rhythm:Regular     Neuro/Psych  Neuromuscular disease    GI/Hepatic Neg liver ROS,GERD  Medicated and Controlled,,  Endo/Other  diabetesHypothyroidism    Renal/GU negative Renal ROS     Musculoskeletal  (+) Arthritis ,    Abdominal   Peds  Hematology  (+) Blood dyscrasia, anemia   Anesthesia Other Findings   Reproductive/Obstetrics                             Anesthesia Physical Anesthesia Plan  ASA: 2  Anesthesia Plan: General   Post-op Pain Management:    Induction: Intravenous  PONV Risk Score and Plan: 3 and Ondansetron and Dexamethasone  Airway Management Planned: Oral ETT  Additional Equipment: None  Intra-op Plan:   Post-operative Plan: Extubation in OR  Informed Consent: I have reviewed the patients History and Physical, chart, labs and discussed the procedure including the risks, benefits and alternatives for the proposed anesthesia with the patient or authorized representative who has indicated his/her understanding and acceptance.     Dental advisory given  Plan Discussed with: CRNA  Anesthesia Plan Comments:        Anesthesia Quick Evaluation

## 2022-02-07 NOTE — H&P (Signed)
02/07/2022.    Patient Care Team: Winfred Leeds, MD as PCP - General (Family Medicine)  PROVIDER: Hollace Kinnier, MD  [facility] MRN: [id number] DOB: 22-Mar-1955  SUBJECTIVE  Chief Complaint: New Consultation (eval of prolapsed int hems)   [name] is a 66 y.o. female who is seen today as an office consultation at the request of NP.Roseanne Kaufman for evaluation of hemorrhoids  History of Present Illness:  66 year old woman that is struggled with hemorrhoids for a while. Had pain and bleeding. Required urgent hemorrhoidectomy in 2011 up in [facility]. Dr. Geroge Baseman. Had a colonoscopy February 2023 with 1 adenomatous polyp and some internal and external hemorrhoids noted. Has other medical problems including diabetes with neuropathy, rheumatoid arthritis on Enbrel immunosuppression. iron deficiency anemia. Radiculopathy, hypertension, pulmonary embolism on Eliquis anticoagulation, hyperlipidemia. She had a worsening hemorrhoid flare. Treated with some topical agents. She had some prolapse and irritation. She followed up with gastrology. Prolapse less but still with discomfort. Felt by gastroenterology that nonsurgical options have been exhausted. Recommended colorectal consultation.  She comes today with her husband. She is prone to trying to do soaks and medications. Cannot tolerate suppositories. She does not have sharp pain with bowel movements but a lot of bleeding and irritation afterwards which is frustrating. She has noticed blood when she wipes and remains anticoagulated she is on Eliquis for pulmonary emboli. She has an IVC filter.  She notes she moves her bowels usually once a day. Not on any fiber. Had a colonoscopy last year that was rather underwhelming. 1 polyp. She notes that she has a lot of pain and discomfort. Burning irritation after bowel movements. No prior fissures. She wonders if she seen a little pus or drainage with it. No UTIs. She does have some right hip arthritis so  limits her ability to walk but she has no exertional chest pain or shortness of breath. No cardiac or pulmonary issues. She does not smoke. She does have diabetes but her hemoglobin A1c is been less than 7 for a while.  Medical History:  Past Medical History: Diagnosis Date Anemia Arthritis Diabetes mellitus without complication (CMS-HCC) DVT (deep venous thrombosis) (CMS-HCC) GERD (gastroesophageal reflux disease) Hyperlipidemia  Patient Active Problem List Diagnosis External hemorrhoids with complication History of adenomatous polyp of colon Prolapsed internal hemorrhoids, grade 4 Chronic anticoagulation Bright red rectal bleeding Immunosuppression due to drug therapy Recurrent pulmonary embolism (CMS-HCC) Presence of IVC filter  Past Surgical History: Procedure Laterality Date APPENDECTOMY COLON SURGERY cyst removed breast cyst removed from neck HYSTERECTOMY tonsilectomy   No Known Allergies  Current Outpatient Medications on File Prior to Visit Medication Sig Dispense Refill amitriptyline (ELAVIL) 25 MG tablet Take 25 mg by mouth at bedtime apixaban (ELIQUIS) 5 mg tablet Take 5 mg by mouth 2 (two) times daily DULoxetine (CYMBALTA) 60 MG DR capsule Take by mouth folic acid (FOLVITE) 1 MG tablet Take 1,000 mcg by mouth once daily gabapentin (NEURONTIN) 800 MG tablet Take 800 mg by mouth 2 (two) times daily levothyroxine (SYNTHROID) 100 MCG tablet Take by mouth metFORMIN (GLUCOPHAGE-XR) 500 MG XR tablet Take 1,000 mg by mouth 2 (two) times daily ZETIA 10 mg tablet 1 tablet Orally Once a day  No current facility-administered medications on file prior to visit.  Family History Problem Relation Age of Onset Coronary Artery Disease (Blocked arteries around heart) Mother High blood pressure (Hypertension) Father Hyperlipidemia (Elevated cholesterol) Father Coronary Artery Disease (Blocked arteries around heart) Father   Social History  Tobacco Use Smoking  Status Former Types: Cigarettes Quit date: 1998 Years since quitting: 25.9 Smokeless Tobacco Never   Social History  Socioeconomic History Marital status: Married Tobacco Use Smoking status: Former Types: Cigarettes Quit date: 1998 Years since quitting: 25.9 Smokeless tobacco: Never Substance and Sexual Activity Alcohol use: Not Currently Drug use: Never  ############################################################  Review of Systems: A complete review of systems (ROS) was obtained from the patient. We have reviewed this information and discussed as appropriate with the patient. See HPI as well for other pertinent ROS.  Constitutional: No fevers, chills, sweats. Weight stable Eyes: No vision changes, No discharge HENT: No sore throats, nasal drainage Lymph: No neck swelling, No bruising easily Pulmonary: No cough, productive sputum CV: No orthopnea, PND . No exertional chest/neck/shoulder/arm pain. Patient can walk 20 minutes gradually.  GI: No personal nor family history of GI/colon cancer, inflammatory bowel disease, irritable bowel syndrome, allergy such as Celiac Sprue, dietary/dairy problems, colitis, ulcers nor gastritis. No recent sick contacts/gastroenteritis. No travel outside the country. No changes in diet.  Renal: No UTIs, No hematuria Genital: No drainage, bleeding, masses Musculoskeletal: No severe joint pain. Right hip stiffness. Good ROM major joints Skin: No sores or lesions Heme/Lymph: No easy bleeding. No swollen lymph nodes Neuro: No active seizures. No facial droop Psych: No hallucinations. No agitation  OBJECTIVE  Vitals: 01/28/22 1600 BP: 120/60 Pulse: 93 Temp: 36.7 C (98 F) SpO2: 97% Weight: 85.3 kg (188 lb) Height: 154.9 cm ('5\' 1"'$ )  Body mass index is 35.52 kg/m.  PHYSICAL EXAM:  Constitutional: Not cachectic. Hygeine adequate. Vitals signs as above. Eyes: No glasses. Vision adequate,Pupils reactive, normal extraocular  movements. Sclera nonicteric Neuro: CN II-XII intact. No major focal sensory defects. No major motor deficits. Lymph: No head/neck/groin lymphadenopathy Psych: No severe agitation. No severe anxiety. Judgment & insight Adequate, Oriented x4, HENT: Normocephalic, Mucus membranes moist. No thrush. Hearing: adequate Neck: Supple, No tracheal deviation. No obvious thyromegaly Chest: No pain to chest wall compression. Good respiratory excursion. No audible wheezing CV: Pulses intact. regular. No major extremity edema Ext: No obvious deformity or contracture. Edema: Not present. No cyanosis Skin: No major subcutaneous nodules. Warm and dry Musculoskeletal: Severe joint rigidity not present. No obvious clubbing. No digital petechiae. Mobility: no assist device moving easily without restrictions  Abdomen: Obese with panniculus Soft. Nondistended. Nontender. Hernia: Present at: umbilicus, size 3M4WO. Diastasis recti: Not present. No hepatomegaly. No splenomegaly.  Genital/Pelvic: Inguinal hernia: Not present. Inguinal lymph nodes: without lymphadenopathy nor hidradenitis.  Rectal:  ##################################  Perianal skin Mild fecal soiling Pruritis ani: Mild Pilonidal disease: Not present Condyloma / warts: Not present  Anal fissure: Not present Perirectal abscess/fistula Not present External hemorrhoids external hemorrhoids. Chronically prolapsed hemorrhoid with irritation right anterior. Very sensitive.  Digital and anoscopic rectal exam Not tolerated Sphincter tone Increased Hemorrhoidal piles Grade 4 prolapsed at 1 pile Prostate: N/A Rectovaginal septum: Rectal masses:  Other significant findings:  Patient examined with assistance of female Medical Assistant in the room with patient in decubitus position . ###################################    ###################################################################  Labs, Imaging and Diagnostic Testing:  Located in  [place]' section of Epic EMR chart  PRIOR [facility] CLINIC NOTES:  Not applicable  SURGERY NOTES:  Located in [place]' section of Epic EMR chart  PATHOLOGY:  Located in [place]' section of Epic EMR chart  Assessment and Plan: DIAGNOSES:  Diagnoses and all orders for this visit:  Prolapsed internal hemorrhoids, grade 4  External hemorrhoids with complication  History of adenomatous polyp  of colon  Chronic anticoagulation  Immunosuppression due to drug therapy  Bright red rectal bleeding  Recurrent pulmonary embolism (CMS-HCC)  Presence of IVC filter    ASSESSMENT/PLAN  Patient with chronic hemorrhoid issues with prior hemorrhoidectomy for bleeding 2011 now with recurrent pain bleeding with chronic external hemorrhoids and chronic prolapsed hemorrhoids despite nonoperative management. She has some complaint of seeing pus but I see no obvious abscess or fistula today. Most likely it is mucus and irritation. I think she require anorectal examination under esthesia with probable hemorrhoidal ligation & pexy and hemorrhoidectomy of redundant tissue. Should be an outpatient surgery.  The anatomy & physiology of the anorectal region was discussed. The pathophysiology of hemorrhoids and differential diagnosis was discussed. Natural history risks without surgery was discussed. I stressed the importance of a bowel regimen to have daily soft bowel movements to minimize progression of disease. Interventions such as sclerotherapy & banding were discussed.  The patient's symptoms are not adequately controlled by medicines and other non-operative treatments. I feel the risks & problems of no surgery outweigh the operative risks; therefore, I recommended surgery to treat the hemorrhoids by ligation, pexy, and possible resection.  Risks such as bleeding, infection, urinary difficulties, injury to other organs, need for repair of tissues / organs, need for further  treatment, heart attack, death, and other risks were discussed. I noted a good likelihood this will help address the problem. Goals of post-operative recovery were discussed as well. Possibility that this will not correct all symptoms was explained. Post-operative pain, bleeding, constipation, and other problems after surgery were discussed. We will work to minimize complications. Educational handouts further explaining the pathology, treatment options, and bowel regimen were given as well. Questions were answered. The patient expresses understanding & wishes to proceed with surgery.  She does have some concerning factors. She has had pulmonary emboli and is chronically antiquated on Eliquis. I need to hold 2 days preop. She has an IVC filter in place and should be able to get back on her Eliquis the next day, so hopefully we can minimize that risk.  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 Does put her risk of slow wound healing or other issues higher but it is what it is.    Adin Hector, MD, FACS, MASCRS Esophageal, Gastrointestinal & Colorectal Surgery Robotic and Minimally Invasive Surgery  [facility] [address], [place], [place] [address] [phone] Fax [phone] Main  CONTACT INFORMATION:  Weekday (9AM-5PM): Call [facility] main office at [phone]  Weeknight (5PM-9AM) or Weekend/Holiday: Check [email or web address] (password " [email or web address]") for General Surgery [facility] coverage  (Please, do not use SecureChat as it is not reliable communication to reach operating surgeons for immediate patient care given surgeries/outpatient duties/clinic/cross-coverage/off post-call which would lead to a delay in care.  Epic staff messaging available for outptient concerns, but may not be answered for 48 hours or more).    02/07/2022

## 2022-02-07 NOTE — Anesthesia Postprocedure Evaluation (Signed)
Anesthesia Post Note  Patient: [name]  Procedure(s) Performed: HEMORRHOIDECTOMY WITH LIGATION AND HEMORRHOIDOPEXY. ANORECTAL EXAMINATION UNDER ANESTHESIA     Patient location during evaluation: PACU Level of consciousness: awake and alert Pain management: pain level controlled Vital Signs Assessment: post-procedure vital signs reviewed and stable Respiratory status: spontaneous breathing, nonlabored ventilation and respiratory function stable Cardiovascular status: blood pressure returned to baseline and stable Postop Assessment: no apparent nausea or vomiting Anesthetic complications: no   No notable events documented.  Last Vitals:  Vitals:   02/07/22 1745 02/07/22 1800  BP: 134/67 123/64  Pulse: 85 84  Resp: 14 13  Temp:  36.4 C  SpO2: 99% 100%    Last Pain:  Vitals:   02/07/22 1800  TempSrc:   PainSc: [name]

## 2022-02-07 NOTE — Interval H&P Note (Signed)
History and Physical Interval Note:  02/07/2022 2:11 PM  [name]  has presented today for surgery, with the diagnosis of HEMORRHOIDS PROLAPSED GRADE 4 WITH BLEEDING AND PAIN.  The various methods of treatment have been discussed with the patient and family. After consideration of risks, benefits and other options for treatment, the patient has consented to  Procedure(s) with comments: HEMORRHOIDECTOMY WITH LIGATION AND HEMORRHOIDOPEXY. ANORECTAL EXAMINATION UNDER ANESTHESIA (N/A) - GEN & LOCAL as a surgical intervention.  The patient's history has been reviewed, patient examined, no change in status, stable for surgery.  I have reviewed the patient's chart and labs.  Questions were answered to the patient's satisfaction.    I have re-reviewed the the patient's records, history, medications, and allergies.  I have re-examined the patient.  I again discussed intraoperative plans and goals of post-operative recovery.  The patient agrees to proceed.  [name]  September 13, 1955 [id number]  Patient Care Team: Judeen Hammans as PCP - General Valinda Party, MD as Consulting Physician (Rheumatology) Eloise Harman, DO as Consulting Physician (Gastroenterology) Marcial Pacas, MD as Consulting Physician (Neurology) Montez Morita, Quillian Quince, MD as Consulting Physician (Gastroenterology) Michael Boston, MD as Consulting Physician (Colon and Rectal Surgery)  Patient Active Problem List   Diagnosis Date Noted   Prolapsed internal hemorrhoids, grade 3 12/11/2021   Grade IV hemorrhoids 11/08/2021   Diabetic peripheral neuropathy ([place]) 10/22/2021   Iron deficiency anemia 07/16/2021   Gastric polyps 07/16/2021   Rheumatoid arthritis ([place]) 10/25/2020   Diabetic radiculopathy ([place]) 04/30/2017   Embolism, 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[place]) 09/12/2016   Uncontrolled type 2 diabetes mellitus with complication, with long-term current use of insulin 03/02/2015   Hyperlipidemia  03/02/2015   Essential hypertension, benign 03/02/2015   Other specified hypothyroidism 03/02/2015   BRADYCARDIA 11/14/2008    Past Medical History:  Diagnosis Date   Anemia    Bradycardia    Diabetes ([place])    Diabetic radiculopathy ([place]) 04/30/2017   High blood pressure    High cholesterol    Hypothyroidism    Pneumonia    Pulmonary embolism ([place])    Rheumatoid arthritis ([place]) 10/25/2020    Past Surgical History:  Procedure Laterality Date   ABDOMINAL HYSTERECTOMY     APPENDECTOMY     BREAST SURGERY Left 1990   benign lump removed   COLONOSCOPY WITH PROPOFOL N/A 04/17/2021   Procedure: COLONOSCOPY WITH PROPOFOL;  Surgeon: Harvel Quale, MD;  [facility];  Service: Gastroenterology;  Laterality: N/A;  1215, moved to 7:30 per Darius Bump at the office, pt knows new arrival time   ESOPHAGOGASTRODUODENOSCOPY (EGD) WITH PROPOFOL N/A 04/17/2021   Procedure: ESOPHAGOGASTRODUODENOSCOPY (EGD) WITH PROPOFOL;  Surgeon: Harvel Quale, MD;  [facility];  Service: Gastroenterology;  Laterality: N/A;   EXCISIONAL HEMORRHOIDECTOMY  2011   Dr. Elby Showers - [facility]   EYE SURGERY Bilateral 2023   cataract surgery w/ lens   IVC FILTER INSERTION N/A 09/12/2016   Procedure: IVC Filter Insertion;  Surgeon: Conrad Ladysmith, MD;  Location: [place] [facility];  Service: Cardiovascular;  Laterality: N/A;   NECK SURGERY     POLYPECTOMY  04/17/2021   Procedure: POLYPECTOMY;  Surgeon: Harvel Quale, MD;  Location: [facility];  Service: Gastroenterology;;   REFRACTIVE SURGERY     TONSILLECTOMY      Social History   Socioeconomic History   Marital status: Married    Spouse name: Not on file  Number of children: 2   Years of education: 12   Highest education level: Not on file  Occupational History   Occupation: Unemployed- disabled  Tobacco Use   Smoking status: Former    Types: Cigarettes    Quit date: 1998    Years since  quitting: 25.9   Smokeless tobacco: Never  Vaping Use   Vaping Use: Never used  Substance and Sexual Activity   Alcohol use: No    Alcohol/week: 0.0 standard drinks of alcohol   Drug use: No   Sexual activity: Yes  Other Topics Concern   Not on file  Social History Narrative   Lives with spouse   Caffeine use: Coffee every morning (1 cup)   Right handed    Social Determinants of Health   Financial Resource Strain: Not on file  Food Insecurity: Not on file  Transportation Needs: Not on file  Physical Activity: Not on file  Stress: Not on file  Social Connections: Not on file  Intimate Partner Violence: Not on file    Family History  Problem Relation Age of Onset   Hypertension Mother    Cancer Mother    Heart Problems Mother    Heart Problems Father    Diabetes Maternal Grandfather     Medications Prior to Admission  Medication Sig Dispense Refill Last Dose   acetaminophen (TYLENOL) 500 MG tablet Take 500 mg by mouth every 6 (six) hours as needed for moderate pain.      amitriptyline (ELAVIL) 25 MG tablet Take 25 mg by mouth at bedtime.      amLODipine (NORVASC) 5 MG tablet Take 5 mg by mouth daily.      apixaban (ELIQUIS) 5 MG TABS tablet Take 5 mg by mouth 2 (two) times daily.      DULoxetine (CYMBALTA) 60 MG capsule Take 60 mg by mouth every evening.       etanercept (ENBREL) 50 MG/ML injection Inject 50 mg into the skin every Monday.      ezetimibe (ZETIA) 10 MG tablet TAKE ONE TABLET BY MOUTH AT BEDTIME. 30 tablet 2    ferrous gluconate (FERGON) 240 (27 FE) MG tablet Take 1 tablet (240 mg total) by mouth 2 (two) times daily. 761 tablet 1    folic acid (FOLVITE) 1 MG tablet Take 1 mg by mouth daily.      HUMALOG 100 UNIT/ML injection Inject 20 Units into the skin 3 (three) times daily before meals.      hydroxypropyl methylcellulose / hypromellose (ISOPTO TEARS / GONIOVISC) 2.5 % ophthalmic solution Place 1 drop into both eyes as needed for dry eyes.      LANTUS  SOLOSTAR 100 UNIT/ML Solostar Pen 40-42 Units See admin instructions. Inject 42 units into the skin in the morning and 40 units in the evening      levothyroxine (SYNTHROID) 100 MCG tablet Take 100 mcg by mouth daily before breakfast.      metFORMIN (GLUCOPHAGE-XR) 500 MG 24 hr tablet Take 1,000 mg by mouth 2 (two) times daily.      olmesartan-hydrochlorothiazide (BENICAR HCT) 40-25 MG per tablet Take 1 tablet by mouth in the morning.      pantoprazole (PROTONIX) 40 MG tablet Take 40 mg by mouth 2 (two) times daily.      Red Yeast Rice Extract 600 MG CAPS Take 1,200 mg by mouth in the morning.      tirzepatide [facility]) 7.5 MG/0.5ML Pen Inject 7.5 mg into the skin every Thursday.  vitamin C (ASCORBIC ACID) 500 MG tablet Take 500 mg by mouth daily.      gabapentin (NEURONTIN) 800 MG tablet TAKE ONE TABLET BY MOUTH TWICE DAILY 180 tablet 1     No current facility-administered medications for this encounter.     No Known Allergies  There were no vitals taken for this visit.  Labs: No results found for this or any previous visit (from the past 48 hour(s)).  Imaging / Studies: No results found.   Adin Hector, M.D., F.A.C.S. Gastrointestinal and Minimally Invasive Surgery [facility] [address] [place], [place] [address] [phone] Main / Paging  02/07/2022 2:11 PM    Adin Hector

## 2022-02-07 NOTE — Transfer of Care (Signed)
Immediate Anesthesia Transfer of Care Note  Patient: Ligia Duguay Troxler Moffet  Procedure(s) Performed: HEMORRHOIDECTOMY WITH LIGATION AND HEMORRHOIDOPEXY. ANORECTAL EXAMINATION UNDER ANESTHESIA  Patient Location: PACU  Anesthesia Type:General  Level of Consciousness: drowsy  Airway & Oxygen Therapy: Patient Spontanous Breathing and Patient connected to face mask oxygen  Post-op Assessment: Report given to RN and Post -op Vital signs reviewed and stable  Post vital signs: Reviewed and stable  Last Vitals:  Vitals Value Taken Time  BP 158/79 02/07/22 1645  Temp    Pulse 75 02/07/22 1646  Resp 14 02/07/22 1646  SpO2 100 % 02/07/22 1646  Vitals shown include unvalidated device data.  Last Pain:  Vitals:   02/07/22 1438  TempSrc:   PainSc: 0-No pain      Patients Stated Pain Goal: 3 (48/54/62 7035)  Complications: No notable events documented.

## 2022-02-07 NOTE — Discharge Instructions (Addendum)
##############################################  ANORECTAL SURGERY:  POST OPERATIVE INSTRUCTIONS  ######################################################################  EAT Start with a pureed / full liquid diet After 24 hours, gradually transition to a high fiber diet.    CONTROL PAIN Control pain so you can tolerate bowel movements,  walk, sleep, tolerate sneezing/coughing, and go up/down stairs.   HAVE A BOWEL MOVEMENT DAILY Keep your bowels regular to avoid problems.   Taking a fiber supplement every day to keep bowels soft.   Try a laxative to override constipation. Use an antidairrheal to slow down diarrhea.   Call if not better after 2 tries  WALK Walk an hour a day.  Control your pain to do that.   CALL IF YOU HAVE PROBLEMS/CONCERNS Call if you are still struggling despite following these instructions. Call if you have concerns not answered by these instructions  ######################################################################    Take your usually prescribed home medications.  It is okay to restart your blood thinner medication (apixiban / Eliquis) tomorrow morning, postop day 1 = Friday 12/15  DIET: Follow a light bland diet & liquids the first 24 hours after arrival home, such as soup, liquids, starches, etc.  Be sure to drink plenty of fluids.  Quickly advance to a usual solid diet within a few days.  Avoid fast food or heavy meals as your are more likely to get nauseated or have irregular bowels.  A low-fat, high-fiber diet for the rest of your life is ideal.  PAIN CONTROL: Expect swelling and discomfort in the anus/rectal area. Pain is best controlled by a usual combination of many methods TOGETHER: Warm baths/soaks or Ice packs Over the counter pain medication Prescription pain medications Topical creams    Warm water baths or ice packs (30-60 minutes up to 8 times a day, especially after bowel meovements) will help. Use ice for the first few days to  help decrease swelling and bruising, then switch to heat such as warm towels, sitz baths, warm baths, warm showers, etc to help relax tight/sore spots and speed recovery.  Some people prefer to use ice alone, heat alone, alternating between ice & heat.  Experiment to what works for you.    It is helpful to take an over-the-counter pain medication continuously for the first few weeks.  Choose one of the following that works best for you: Naproxen (Aleve, etc)  Two '220mg'$  tabs twice a day Ibuprofen (Advil, etc) Three '200mg'$  tabs four times a day (every meal & bedtime) Acetaminophen (Tylenol, etc) 500-'650mg'$  four times a day (every meal & bedtime)  A  prescription for pain medication (such as oxycodone, hydrocodone, etc) should be given to you upon discharge.  Take your pain medication as prescribed.  If you are having problems/concerns with the prescription medicine (does not control pain, nausea, vomiting, rash, itching, etc), please call us [phone] to see if we need to switch you to a different pain medicine that will work better for you and/or control your side effect better. If you need a refill on your pain medication, please contact your pharmacy.  They will contact our office to request authorization. Prescriptions will not be filled after 5 pm or on week-ends.  If can take up to 48 hours for it to be filled & ready so avoid waiting until you are down to thel ast pill.  A topical cream (Dibucaine) or a prescription for a cream (such as diltiazem 2% gel) may be given to you.  Many people find relief with topical creams.  Some people  find it burns too much.  Experiment.  If it helps, use it.  If it burns, don't using it.  You also may receive a prescription for diazepam, a muscle relaxant to help you to be able to urinate and defecate more easily.  It is safe to take a few doses with the other medications as long as you are not planning to drive or do anything intense.  Hopefully this can  minimize the chance of needing a Foley catheter into your bladder     KEEP YOUR BOWELS REGULAR The goal is one soft bowel movement a day Avoid getting constipated.  Between the surgery and the pain medications, it is common to experience some constipation.  Increasing fluid intake and taking a fiber supplement (such as Metamucil, Citrucel, FiberCon, MiraLax, etc) 2-4 times a day regularly will usually help prevent this problem from occurring.  A mild laxative (prune juice, Milk of Magnesia, MiraLax, etc) should be taken according to package directions if there are no bowel movements after 48 hours. Watch out for diarrhea.  If you have many loose bowel movements, simplify your diet to bland foods & liquids for a few days.  Stop any stool softeners and decrease your fiber supplement.  Switching to mild anti-diarrheal medications (Kayopectate, Pepto Bismol) can help.  Can try an imodium/loperamide dose.  If this worsens or does not improve, please call us.  Wound Care   a. You have some fluffed gauze on top of the anus to help catch drainage and bleeding.  THERE IS NO PACKING INSIDE THE RECTUM -Let the gauze fall off with the first bowel movement or shower.  It is okay to reinforce or replace as needed.  Bleeding is common at first and occasionally tapers off   b. Place soft cotton balls on the anus/wounds and use an absorbent pad in your underwear as needed to catch any drainage and help keep the area.  Try to use cotton balls or pads over regular gauze or toilet paper as gauze will stick and pull, causing pain.  Cotton will come off more easily.   c. Keep the area clean and dry.  Bathe / shower every day.  Keep the area clean by showering / bathing over the incision / wound.   It is okay to soak an open wound to help wash it.  Consider using a squeeze bottle filled with warm water to gently wash the anal area.  Wet wipes or showers / gentle washing after bowel movements is often less traumatic than  regular toilet paper.  Use a Sitz Bath 4-8 times a day for relief  A sitz bath is a warm water bath taken in the sitting position that covers only the hips and buttocks. It may be used for either healing or hygiene purposes. Sitz baths are also used to relieve pain, itching, or muscle spasms.  Gently cleaned the area and the heat will help lower spasm and offer better pain control.    Fill the bathtub half full with warm water. Sit in the water and open the drain a little. Turn on the warm water to keep the tub half full. Keep the water running constantly. Soak in the water for 15 to 20 minutes. After the sitz bath, pat the affected area dry first.   d. You will often notice bleeding, especially with bowel movements.  This should slow down by the end of the first week of surgery.  Sitting on an ice pack can help.  e. Expect some drainage.  You often will have some blood or yellow drainage with open wounds.  Sometimes she will get a little leaking of liquid stool until the incision/wounds have fully close down.  This should slow down by the end of the first week of surgery, but you will have occasional bleeding or drainage up to a few months after surgery.  Wear an absorbent pad or soft cotton gauze in your underwear until the drainage stops.  ACTIVITIES as tolerated:    You may resume regular (light) daily activities beginning the next day--such as daily self-care, walking, climbing stairs--gradually increasing activities as tolerated.  If you can walk 30 minutes without difficulty, it is safe to try more intense activity such as jogging, treadmill, bicycling, low-impact aerobics, swimming, etc. Save the most intensive and strenuous activity for last such as sit-ups, heavy lifting, contact sports, etc  Refrain from any heavy lifting or straining until you are off narcotics for pain control.   DO NOT PUSH THROUGH PAIN.  Let pain be your guide: If it hurts to do something, don't do it.  Pain is  your body warning you to avoid that activity for another week until the pain goes down. You may drive when you are no longer taking prescription pain medication, you can comfortably sit for long periods of time, and you can safely maneuver your car and apply brakes. You may have sexual intercourse when it is comfortable.   FOLLOW UP in our office Please call [facility] at [phone] to set up an appointment to see your surgeon in the office for a follow-up appointment approximately 3 weeks after your surgery. Make sure that you call for this appointment the day you arrive home to ensure a convenient appointment time.  8. IF YOU HAVE DISABILITY OR FAMILY LEAVE FORMS, BRING THEM TO THE OFFICE FOR PROCESSING.  DO NOT GIVE THEM TO YOUR DOCTOR.        WHEN TO CALL us [phone]: Poor pain control Reactions / problems with new medications (rash/itching, nausea, etc)  Fever over 101.5 F (38.5 C) Inability to urinate Nausea and/or vomiting Worsening swelling or bruising Continued bleeding from incision. Increased pain, redness, or drainage from the incision  The clinic staff is available to answer your questions during regular business hours (8:30am-5pm).  Please don't hesitate to call and ask to speak to one of our nurses for clinical concerns.   A surgeon from [facility] is always on call at the hospitals   If you have a medical emergency, go to the nearest emergency room or call 911.    [facility], [place], [place], [place],   [address] ? MAIN: [phone] ? TOLL FREE: [phone] ? FAX [phone] [email or web address]  #####################################################    HEMORRHOIDS   Hemorrhoidal piles are natural clusters of blood vessels that help the rectum and anal canal stretch to hold stool and allow bowel movements.  Most people will develop a flare of hemorrhoids in their lifetime.  When  hemorrhoidals are irritated, they can swell, burn, itch, cause pain, and bleed.  Most flares will calm down gradually within a few weeks.  However, once hemorrhoids are created, they tend to flare more easily.  Fortunately, good habits and simple medical treatment usually control hemorrhoids well, and surgery is needed only in severe cases.  TREATMENT OF HEMORRHOID FLARE Warm soaks. 4-8 times a day This helps more than any topical medication.   A sitz  bath is a warm water bath taken in the sitting position that covers only the hips and buttocks.Fill the bathtub half full with warm water. Soak in the water for 15 to 30 minutes. After the sitz bath, pat the affected area dry first.  Normalize your bowels.  Extremes of diarrhea or constipation will make hemorrhoids worse.  One soft bowel movement a day is the goal.   Wet wipes instead of toilet paper Pain control with a NSAID such as ibuprofen (Advil) or naproxen (Aleve) or acetaminophen (Tylenol) around the clock.  Narcotics are constipating and should be minimized if possible Topical creams contain steroids (bydrocortisone) or local anesthetic (xylocaine) can help make pain and itching more tolerable.    TROUBLESHOOTING IRREGULAR BOWELS 1) Avoid extremes of bowel movements (no bad constipation/diarrhea) 2) Miralax 17gm in 8oz. water or juice every day. May use twice a day.  3) Gas-x or Phazyme as needed for gas & bloating.  4) Soft & bland diet. No spicy, greasy, or fried foods.  5) Omeprazole over-the-counter as needed  6) May hold gluten/wheat products from diet to see if symptoms improve.  7)  May try probiotics (Align, Activa, etc) to help calm the bowels down 7) If symptoms become worse: Call back immediately.

## 2022-02-07 NOTE — Anesthesia Procedure Notes (Signed)
Procedure Name: Intubation Date/Time: 02/07/2022 3:18 PM  Performed by: Milford Cage, CRNAPre-anesthesia Checklist: Patient identified, Emergency Drugs available, Suction available and Patient being monitored Patient Re-evaluated:Patient Re-evaluated prior to induction Oxygen Delivery Method: Circle system utilized Preoxygenation: Pre-oxygenation with 100% oxygen Induction Type: IV induction Ventilation: Mask ventilation without difficulty Laryngoscope Size: Mac and 4 Grade View: Grade I Tube type: Oral Tube size: 7.5 mm Number of attempts: 1 Airway Equipment and Method: Stylet Placement Confirmation: ETT inserted through vocal cords under direct vision, positive ETCO2 and breath sounds checked- equal and bilateral Secured at: 21 cm Tube secured with: Tape Dental Injury: Teeth and Oropharynx as per pre-operative assessment

## 2022-02-08 ENCOUNTER — Other Ambulatory Visit: Payer: Self-pay

## 2022-02-08 ENCOUNTER — Encounter (HOSPITAL_COMMUNITY): Payer: Self-pay | Admitting: Surgery

## 2022-02-11 LAB — SURGICAL PATHOLOGY

## 2023-10-29 ENCOUNTER — Other Ambulatory Visit (HOSPITAL_COMMUNITY): Payer: Self-pay | Admitting: Nurse Practitioner

## 2023-10-29 DIAGNOSIS — N184 Chronic kidney disease, stage 4 (severe): Secondary | ICD-10-CM

## 2023-10-29 DIAGNOSIS — E785 Hyperlipidemia, unspecified: Secondary | ICD-10-CM

## 2023-11-06 ENCOUNTER — Ambulatory Visit (HOSPITAL_COMMUNITY)
Admission: RE | Admit: 2023-11-06 | Discharge: 2023-11-06 | Disposition: A | Discharge status: Home / Self Care | Source: Ambulatory Visit | Attending: Nurse Practitioner | Admitting: Nurse Practitioner

## 2023-11-06 DIAGNOSIS — E785 Hyperlipidemia, unspecified: Secondary | ICD-10-CM | POA: Diagnosis present

## 2023-11-06 DIAGNOSIS — N184 Chronic kidney disease, stage 4 (severe): Secondary | ICD-10-CM | POA: Diagnosis present

## 2023-12-11 ENCOUNTER — Telehealth: Payer: Self-pay | Admitting: *Deleted

## 2023-12-11 NOTE — Telephone Encounter (Signed)
 Received referral from Annabella Bash, NP with [facility]~ [phone]~ telephone/ [phone]~ fax  Reason for referral: increasing adrenal mass  09/11/225 US  Impression: Known right adrenal mass measuring 6.5 cm as this measures larger compared to the previous CT of 10/22/2018. This was indeterminate on previous MRI from 2018. Recommend further evaluation with dedicated adrenal protocol CT or MRI.  RSA does not manage adrenal masses. Advised to send referral to Leonor Dawn, MD with [facility] [phone]~ telephone/ [phone]~ fax.

## 2023-12-12 ENCOUNTER — Inpatient Hospital Stay

## 2023-12-12 ENCOUNTER — Inpatient Hospital Stay: Attending: Oncology | Admitting: Oncology

## 2023-12-12 DIAGNOSIS — Z79899 Other long term (current) drug therapy: Secondary | ICD-10-CM | POA: Insufficient documentation

## 2023-12-12 DIAGNOSIS — Z8719 Personal history of other diseases of the digestive system: Secondary | ICD-10-CM | POA: Insufficient documentation

## 2023-12-12 DIAGNOSIS — Z7984 Long term (current) use of oral hypoglycemic drugs: Secondary | ICD-10-CM | POA: Insufficient documentation

## 2023-12-12 DIAGNOSIS — E279 Disorder of adrenal gland, unspecified: Secondary | ICD-10-CM | POA: Diagnosis present

## 2023-12-12 DIAGNOSIS — N184 Chronic kidney disease, stage 4 (severe): Secondary | ICD-10-CM | POA: Insufficient documentation

## 2023-12-12 DIAGNOSIS — Z7985 Long-term (current) use of injectable non-insulin antidiabetic drugs: Secondary | ICD-10-CM | POA: Diagnosis not present

## 2023-12-12 DIAGNOSIS — Z794 Long term (current) use of insulin: Secondary | ICD-10-CM | POA: Insufficient documentation

## 2023-12-12 DIAGNOSIS — I2510 Atherosclerotic heart disease of native coronary artery without angina pectoris: Secondary | ICD-10-CM | POA: Insufficient documentation

## 2023-12-12 DIAGNOSIS — E278 Other specified disorders of adrenal gland: Secondary | ICD-10-CM

## 2023-12-12 DIAGNOSIS — E039 Hypothyroidism, unspecified: Secondary | ICD-10-CM | POA: Diagnosis not present

## 2023-12-12 DIAGNOSIS — E7800 Pure hypercholesterolemia, unspecified: Secondary | ICD-10-CM | POA: Insufficient documentation

## 2023-12-12 DIAGNOSIS — Z86711 Personal history of pulmonary embolism: Secondary | ICD-10-CM | POA: Insufficient documentation

## 2023-12-12 DIAGNOSIS — E1122 Type 2 diabetes mellitus with diabetic chronic kidney disease: Secondary | ICD-10-CM | POA: Diagnosis not present

## 2023-12-12 DIAGNOSIS — Z809 Family history of malignant neoplasm, unspecified: Secondary | ICD-10-CM | POA: Insufficient documentation

## 2023-12-12 DIAGNOSIS — Z7901 Long term (current) use of anticoagulants: Secondary | ICD-10-CM | POA: Insufficient documentation

## 2023-12-12 DIAGNOSIS — M069 Rheumatoid arthritis, unspecified: Secondary | ICD-10-CM | POA: Insufficient documentation

## 2023-12-12 DIAGNOSIS — G8929 Other chronic pain: Secondary | ICD-10-CM | POA: Diagnosis not present

## 2023-12-12 DIAGNOSIS — Z79890 Hormone replacement therapy (postmenopausal): Secondary | ICD-10-CM | POA: Diagnosis not present

## 2023-12-12 LAB — COMPREHENSIVE METABOLIC PANEL WITH GFR
ALT: 11 U/L (ref 0–44)
AST: 18 U/L (ref 15–41)
Albumin: 4.3 g/dL (ref 3.5–5.0)
Alkaline Phosphatase: 64 U/L (ref 38–126)
Anion gap: 10 (ref 5–15)
BUN: 24 mg/dL — ABNORMAL HIGH (ref 8–23)
CO2: 28 mmol/L (ref 22–32)
Calcium: 10 mg/dL (ref 8.9–10.3)
Chloride: 97 mmol/L — ABNORMAL LOW (ref 98–111)
Creatinine, Ser: 1.69 mg/dL — ABNORMAL HIGH (ref 0.44–1.00)
GFR, Estimated: 33 mL/min — ABNORMAL LOW (ref >60)
Glucose, Bld: 131 mg/dL — ABNORMAL HIGH (ref 70–99)
Potassium: 5 mmol/L (ref 3.5–5.1)
Sodium: 135 mmol/L (ref 135–145)
Total Bilirubin: 0.2 mg/dL (ref 0.0–1.2)
Total Protein: 6.2 g/dL — ABNORMAL LOW (ref 6.5–8.1)

## 2023-12-12 LAB — CBC WITH DIFFERENTIAL/PLATELET
Abs Immature Granulocytes: 0.04 K/uL (ref 0.00–0.07)
Basophils Absolute: 0.1 K/uL (ref 0.0–0.1)
Basophils Relative: 1 %
Eosinophils Absolute: 0.3 K/uL (ref 0.0–0.5)
Eosinophils Relative: 3 %
HCT: 35.5 % — ABNORMAL LOW (ref 36.0–46.0)
Hemoglobin: 11.9 g/dL — ABNORMAL LOW (ref 12.0–15.0)
Immature Granulocytes: 0 %
Lymphocytes Relative: 29 %
Lymphs Abs: 3.1 K/uL (ref 0.7–4.0)
MCH: 34 pg (ref 26.0–34.0)
MCHC: 33.5 g/dL (ref 30.0–36.0)
MCV: 101.4 fL — ABNORMAL HIGH (ref 80.0–100.0)
Monocytes Absolute: 0.9 K/uL (ref 0.1–1.0)
Monocytes Relative: 8 %
Neutro Abs: 6.5 K/uL (ref 1.7–7.7)
Neutrophils Relative %: 59 %
Platelets: 253 K/uL (ref 150–400)
RBC: 3.5 MIL/uL — ABNORMAL LOW (ref 3.87–5.11)
RDW: 14.6 % (ref 11.5–15.5)
WBC: 10.9 K/uL — ABNORMAL HIGH (ref 4.0–10.5)
nRBC: 0 % (ref 0.0–0.2)

## 2023-12-12 LAB — CORTISOL: Cortisol, Plasma: 10.1 ug/dL

## 2023-12-12 NOTE — Patient Instructions (Addendum)
 [facility] - [facility]  Discharge Instructions   Rapid Diagnostic Service Visit Discharge Information and Instructions  Thank you for choosing [facility] for your healthcare needs.  Below is a summary of today's discussion, along with our contact information and an outline of what to expect next.  Reason for Visit:  adrenal mass   Proposed Diagnostic Care Plan: Labs today. We will schedule you for a CT scan. 3.  You will collect 24 hours of urine for testing.  What to Expect: - Generally, when lab tests are ordered the results can take up to 1 week for results to be available.  At that point, we will contact you to discuss your results with you.  Unless there is a critical result, we will typically wait for all of your lab results to be available before contacting you. - If a biopsy is part of your Care Plan, those results can take on average 7-10 days to result.  Once results are available, we will contact you to discuss your pathology results and any next steps. - If you have additional imaging ordered, such as a CT Scan, MRI, Ultrasound, Bone Scan, or PET scan, your imaging will need to be authorized then scheduled with the earliest available appointment.  You may be asked to travel to another hospital within [facility] who has a sooner availability, please consider doing so if asked. - If you use MyChart, your results will be available to you in the MyChart portal.  Your provider will be in touch with you as soon as all of your results are available to be discussed.  Your Diagnostic Clinic Provider:  Delon Hope, NP. Your Diagnostic Navigator:  Dena Daring, RN, contact number [phone].  If you or your caregiver have number blocking on your cell phones, please ensure the cancer center's numbers are not blocked.  If you are not a registered MyChart user, please consider enrolling in MyChart to receive your test results and visit notes.  You can also access  your discharge instructions electronically.  MyChart also gives you an electronic means to communicate with your Care Team instead of needing to call in to the cancer center.  We appreciate you trusting us  with your healthcare and look forward to partnering with you as we work to uncover what your potential diagnosis may be.  Please do not hesitate to reach out at any point with questions or concerns.     Thank you for choosing  [facility] to provide your oncology and hematology care.   To afford each patient quality time with our provider, please arrive at least 15 minutes before your scheduled appointment time. You may need to reschedule your appointment if you arrive late (10 or more minutes). Arriving late affects you and other patients whose appointments are after yours.  Also, if you miss three or more appointments without notifying the office, you may be dismissed from the clinic at the provider's discretion.    Again, thank you for choosing [facility].  Our hope is that these requests will decrease the amount of time that you wait before being seen by our physicians.   If you have a lab appointment with the Cancer Center - please note that after April 8th, all labs will be drawn in the cancer center.  You do not have to check in or register with the main entrance as you have in the past but will complete your  check-in at the cancer center.            _____________________________________________________________  Should you have questions after your visit to [facility], please contact our office at [phone] and follow the prompts.  Our office hours are 8:00 a.m. to 4:30 p.m. Monday - Thursday and 8:00 a.m. to 2:30 p.m. Friday.  Please note that voicemails left after 4:00 p.m. may not be returned until the following business day.  We are closed weekends and all major holidays.  You do have access to a nurse 24-7, just call the main  number to the clinic [phone] and do not press any options, hold on the line and a nurse will answer the phone.    For prescription refill requests, have your pharmacy contact our office and allow 72 hours.    Masks are no longer required in the cancer centers. If you would like for your care team to wear a mask while they are taking care of you, please let them know. You may have one support person who is at least 66 years old accompany you for your appointments.

## 2023-12-12 NOTE — Progress Notes (Signed)
 Rapid Diagnostic Clinic [facility] [phone]   [phone]  INITIAL CONSULTATION:  Patient Care Team: Lonna Maxwell, PA-C as PCP - General Leni Marjory MATSU, MD as Consulting Physician (Rheumatology) Cindie Carlin POUR, DO as Consulting Physician (Gastroenterology) Onita Duos, MD as Consulting Physician (Neurology) Eartha Flavors, Toribio, MD as Consulting Physician (Gastroenterology) Sheldon Standing, MD as Consulting Physician (Colon and Rectal Surgery) Delores Dena RAMAN, RN as Oncology Nurse Navigator  CHIEF COMPLAINTS/PURPOSE OF CONSULTATION:  Adrenal mass  HISTORY OF PRESENTING ILLNESS:  [name] 68 y.o. female with medical history significant for diabetes, DVT, GERD, hyperlipidemia, IDA, hemorrhoidal bleeding and RA.  Patient has had chronic hemorrhoid problems with acute bleeding and pain.  She has had several hemorrhoidectomies and most recently with Dr. Sheldon on 04/02/2022.   On chart review, it appears she has had a 2 areas on her right adrenal that have been present since 2018.  The larger right adrenal mass measures originally measured 4.7 x 4.3 x 4.0 cm and smaller right adrenal mass measures 1.9 x 1.3 cm.  The left adrenal gland continues to have normal appearance.  Additional imaging from 2020 shows stability of the adrenal mass.  Patient was most recently seen by nephrology for stage IV chronic kidney disease and noted to have an increase in adrenal mass to 6.5 cm from 2020.  This was indeterminant on previous MRI from 2018.  Recommend further evaluation with dedicated adrenal protocol CT or MRI.  On exam today patient is feeling well.  Reports chronic fatigue and low appetite levels.  No unintentional weight loss.  She has shortness of breath with exertion which limits her ability to walk for long periods.  Has dizziness when she stands too quickly and/or bends over to pet her cat.  Dizziness is self-limiting.  She has  numbness and burning from her knees down and some swelling in her left ankle.  Has trouble sleeping. Denies any falls.  Family history of cancers.  Most of her family has cardiovascular disease.  He is a non-smoker and drinker.  MEDICAL HISTORY:  Past Medical History:  Diagnosis Date   Anemia    Bradycardia    Diabetes (HCC)    Diabetic radiculopathy (HCC) 04/30/2017   High blood pressure    High cholesterol    Hypothyroidism    Pneumonia    Pulmonary embolism (HCC)    Rheumatoid arthritis (HCC) 10/25/2020    SURGICAL HISTORY: Past Surgical History:  Procedure Laterality Date   ABDOMINAL HYSTERECTOMY     APPENDECTOMY     BREAST SURGERY Left 1990   benign lump removed   COLONOSCOPY WITH PROPOFOL  N/A 04/17/2021   Procedure: COLONOSCOPY WITH PROPOFOL ;  Surgeon: Eartha Flavors Toribio, MD;  Location: [facility];  Service: Gastroenterology;  Laterality: N/A;  1215, moved to 7:30 per Anette Caldron at the office, pt knows new arrival time   ESOPHAGOGASTRODUODENOSCOPY (EGD) WITH PROPOFOL  N/A 04/17/2021   Procedure: ESOPHAGOGASTRODUODENOSCOPY (EGD) WITH PROPOFOL ;  Surgeon: Eartha Flavors Toribio, MD;  Location: [facility];  Service: Gastroenterology;  Laterality: N/A;   EVALUATION UNDER ANESTHESIA WITH HEMORRHOIDECTOMY N/A 02/07/2022   Procedure: HEMORRHOIDECTOMY WITH LIGATION AND HEMORRHOIDOPEXY. ANORECTAL EXAMINATION UNDER ANESTHESIA;  Surgeon: Sheldon Standing, MD;  Location: [facility];  Service: General;  Laterality: N/A;  GEN & LOCAL   EXCISIONAL HEMORRHOIDECTOMY  2011   Dr. Alwin - [facility]   EYE SURGERY Bilateral 2023   cataract surgery w/ lens   IVC FILTER INSERTION N/A  09/12/2016   Procedure: IVC Filter Insertion;  Surgeon: Laurence Redell CROME, MD;  Location: [facility];  Service: Cardiovascular;  Laterality: N/A;   NECK SURGERY     POLYPECTOMY  04/17/2021   Procedure: POLYPECTOMY;  Surgeon: Eartha Flavors, Toribio, MD;  Location: [facility];  Service:  Gastroenterology;;   REFRACTIVE SURGERY     TONSILLECTOMY      SOCIAL HISTORY: Social History   Socioeconomic History   Marital status: Married    Spouse name: Not on file   Number of children: 2   Years of education: 12   Highest education level: Not on file  Occupational History   Occupation: Unemployed- disabled  Tobacco Use   Smoking status: Former    Current packs/day: 0.00    Types: Cigarettes    Quit date: 1998    Years since quitting: 27.8   Smokeless tobacco: Never  Vaping Use   Vaping status: Never Used  Substance and Sexual Activity   Alcohol  use: No    Alcohol /week: 0.0 standard drinks of alcohol    Drug use: No   Sexual activity: Yes  Other Topics Concern   Not on file  Social History Narrative   Lives with spouse   Caffeine use: Coffee every morning (1 cup)   Right handed    Social Drivers of Health   Financial Resource Strain: Not on file  Food Insecurity: No Food Insecurity (12/12/2023)   Hunger Vital Sign    Worried About Running Out of Food in the Last Year: Never true    Ran Out of Food in the Last Year: Never true  Transportation Needs: No Transportation Needs (12/12/2023)   PRAPARE - Administrator, Civil Service (Medical): No    Lack of Transportation (Non-Medical): No  Physical Activity: Not on file  Stress: Not on file  Social Connections: Not on file  Intimate Partner Violence: Not At Risk (12/12/2023)   Humiliation, Afraid, Rape, and Kick questionnaire    Fear of Current or Ex-Partner: No    Emotionally Abused: No    Physically Abused: No    Sexually Abused: No    FAMILY HISTORY: Family History  Problem Relation Age of Onset   Hypertension Mother    Cancer Mother    Heart Problems Mother    Heart Problems Father    Diabetes Maternal Grandfather     ALLERGIES:  has no known allergies.  MEDICATIONS:  Current Outpatient Medications  Medication Sig Dispense Refill   acetaminophen  (TYLENOL ) 500 MG tablet Take 500  mg by mouth every 6 (six) hours as needed for moderate pain.     amitriptyline (ELAVIL) 25 MG tablet Take 25 mg by mouth at bedtime.     amLODipine (NORVASC) 5 MG tablet Take 5 mg by mouth daily.     apixaban (ELIQUIS) 5 MG TABS tablet Take 5 mg by mouth 2 (two) times daily.     diazepam  (VALIUM ) 5 MG tablet Take 1 tablet (5 mg total) by mouth every 8 (eight) hours as needed for muscle spasms (difficulty urinating). 6 tablet 1   DULoxetine (CYMBALTA) 60 MG capsule Take 60 mg by mouth every evening.      etanercept (ENBREL) 50 MG/ML injection Inject 50 mg into the skin every Monday.     ezetimibe (ZETIA) 10 MG tablet TAKE ONE TABLET BY MOUTH AT BEDTIME. 30 tablet 2   ferrous gluconate  (FERGON) 240 (27 FE) MG tablet Take 1 tablet (240 mg total) by mouth  2 (two) times daily. 180 tablet 1   folic acid  (FOLVITE ) 1 MG tablet Take 1 mg by mouth daily.     gabapentin  (NEURONTIN ) 800 MG tablet TAKE ONE TABLET BY MOUTH TWICE DAILY 180 tablet 1   HUMALOG 100 UNIT/ML injection Inject 20 Units into the skin 3 (three) times daily before meals.     hydroxypropyl methylcellulose / hypromellose (ISOPTO TEARS / GONIOVISC) 2.5 % ophthalmic solution Place 1 drop into both eyes as needed for dry eyes.     LANTUS  SOLOSTAR 100 UNIT/ML Solostar Pen 40-42 Units See admin instructions. Inject 42 units into the skin in the morning and 40 units in the evening     levothyroxine  (SYNTHROID ) 100 MCG tablet Take 100 mcg by mouth daily before breakfast.     metFORMIN (GLUCOPHAGE-XR) 500 MG 24 hr tablet Take 1,000 mg by mouth 2 (two) times daily.     olmesartan-hydrochlorothiazide (BENICAR HCT) 40-25 MG per tablet Take 1 tablet by mouth in the morning.     oxyCODONE  (OXY IR/ROXICODONE ) 5 MG immediate release tablet Take 1 tablet (5 mg total) by mouth every 6 (six) hours as needed for severe pain or breakthrough pain. 30 tablet 0   oxyCODONE  (ROXICODONE ) 5 MG immediate release tablet Take 1 tablet (5 mg total) by mouth every 6 (six)  hours as needed for severe pain. 10 tablet 0   pantoprazole (PROTONIX) 40 MG tablet Take 40 mg by mouth 2 (two) times daily.     Red Yeast Rice Extract 600 MG CAPS Take 1,200 mg by mouth in the morning.     tirzepatide (MOUNJARO) 7.5 MG/0.5ML Pen Inject 7.5 mg into the skin every Thursday.     vitamin C (ASCORBIC ACID) 500 MG tablet Take 500 mg by mouth daily.     No current facility-administered medications for this visit.    REVIEW OF SYSTEMS:   Constitutional: ( - ) fevers, ( - )  chills , ( - ) night sweats Eyes: ( - ) blurriness of vision, ( - ) double vision, ( - ) watery eyes Ears, nose, mouth, throat, and face: ( - ) mucositis, ( - ) sore throat Respiratory: ( - ) cough, ( + ) dyspnea, ( - ) wheezes Cardiovascular: ( - ) palpitation, ( - ) chest discomfort, ( +) left lower extremity swelling Gastrointestinal:  ( - ) nausea, ( - ) heartburn, ( - ) change in bowel habits Skin: ( - ) abnormal skin rashes Lymphatics: ( - ) new lymphadenopathy, ( - ) easy bruising Neurological: ( - ) numbness, ( + ) tingling, ( - ) new weaknesses, dizziness when standing. Behavioral/Psych: ( - ) mood change, ( - ) new changes  All other systems were reviewed with the patient and are negative.  PHYSICAL EXAMINATION: ECOG PERFORMANCE STATUS: 1 - Symptomatic but completely ambulatory  There were no vitals filed for this visit. There were no vitals filed for this visit.  Physical Exam Constitutional:      Appearance: Normal appearance.  Cardiovascular:     Rate and Rhythm: Normal rate and regular rhythm.  Pulmonary:     Effort: Pulmonary effort is normal.     Breath sounds: Normal breath sounds.  Abdominal:     General: Bowel sounds are normal. There is no distension.     Palpations: Abdomen is soft.     Tenderness: There is no abdominal tenderness. There is no right CVA tenderness or left CVA tenderness.  Musculoskeletal:  General: No swelling. Normal range of motion.  Neurological:      Mental Status: She is alert and oriented to person, place, and time. Mental status is at baseline.     LABORATORY DATA:  I have reviewed the data as listed    Latest Ref Rng & Units 02/01/2022    8:30 AM 04/17/2021    7:17 AM 04/13/2021    2:11 PM  CBC  WBC 4.0 - 10.5 K/uL 9.8  8.8  11.3   Hemoglobin 12.0 - 15.0 g/dL 87.9  6.9  7.0   Hematocrit 36.0 - 46.0 % 36.7  24.3  25.4   Platelets 150 - 400 K/uL 218  280  303        Latest Ref Rng & Units 02/01/2022    8:30 AM 04/13/2021    2:11 PM 10/22/2018   10:22 AM  CMP  Glucose 70 - 99 mg/dL 859  94    BUN 8 - 23 mg/dL 16  18    Creatinine 9.55 - 1.00 mg/dL 8.77  8.78  8.99   Sodium 135 - 145 mmol/L 133  133    Potassium 3.5 - 5.1 mmol/L 4.2  4.5    Chloride 98 - 111 mmol/L 97  97    CO2 22 - 32 mmol/L 25  23    Calcium 8.9 - 10.3 mg/dL 9.6  9.5       RADIOGRAPHIC STUDIES: I have personally reviewed the radiological images as listed and agreed with the findings in the report. No results found.  ASSESSMENT & PLAN Assessment & Plan Adrenal mass greater than 4 cm in diameter Discussed differentials including adrenal adenoma which are typically benign versus adrenal carcinoma. Will get basic labs today, cortisol levels and ACTH.  Will also get 24-hour urine metanephrine. She will need dedicated CT of her adrenals for better characterization of mass. Return to clinic after CT scan to review results    Orders Placed This Encounter  Procedures   CT ADRENAL ABDOMEN WO CONTRAST    Standing Status:   Future    Expected Date:   12/19/2023    Expiration Date:   12/11/2024    Preferred imaging location?:   [facility]    If indicated for the ordered procedure, I authorize the administration of oral contrast media per Radiology protocol:   Yes    Does the patient have a contrast media/X-ray dye allergy?:   No   CBC with Differential    Standing Status:   Future    Number of Occurrences:   1    Expected Date:   12/12/2023     Expiration Date:   12/11/2024   Comprehensive metabolic panel    Standing Status:   Future    Number of Occurrences:   1    Expected Date:   12/12/2023    Expiration Date:   12/11/2024   ACT[facility]   Cortisol    Standing Status:   Future    Number of Occurrences:   1    Expected Date:   12/12/2023    Expiration Date:   12/11/2024   Metanephrines, urine, 24 hour    Standing Status:   Future    Expected Date:   12/12/2023    Expiration Date:   12/11/2024    All questions were answered. The patient knows to call the clinic with any problems, questions or concerns.  I have spent a total of 40 minutes minutes of face-to-face and non-face-to-face  time, preparing to see the patient, obtaining and/or reviewing separately obtained history, performing a medically appropriate examination, counseling and educating the patient, ordering medications/tests/procedures, referring and communicating with other health care professionals, documenting clinical information in the electronic health record, independently interpreting results and communicating results to the patient, and care coordination.   Delon Hope, AGNP-C Department of Hematology/Oncology [facility] at [facility] Phone: [phone]

## 2023-12-13 NOTE — Addendum Note (Signed)
 Addended by: Callen Vancuren on: 12/13/2023 09:32 PM   Modules accepted: Level of Service

## 2023-12-15 ENCOUNTER — Other Ambulatory Visit (HOSPITAL_COMMUNITY): Payer: Self-pay

## 2023-12-15 DIAGNOSIS — E279 Disorder of adrenal gland, unspecified: Secondary | ICD-10-CM | POA: Diagnosis not present

## 2023-12-15 DIAGNOSIS — E278 Other specified disorders of adrenal gland: Secondary | ICD-10-CM

## 2023-12-15 LAB — ACTH: C206 ACTH: 25.7 pg/mL (ref 7.2–63.3)

## 2023-12-18 ENCOUNTER — Ambulatory Visit (HOSPITAL_COMMUNITY)
Admission: RE | Admit: 2023-12-18 | Discharge: 2023-12-18 | Disposition: A | Discharge status: Home / Self Care | Source: Ambulatory Visit | Attending: Oncology | Admitting: Oncology

## 2023-12-18 ENCOUNTER — Other Ambulatory Visit: Payer: Self-pay | Admitting: Oncology

## 2023-12-18 DIAGNOSIS — K7460 Unspecified cirrhosis of liver: Secondary | ICD-10-CM | POA: Insufficient documentation

## 2023-12-18 DIAGNOSIS — E278 Other specified disorders of adrenal gland: Secondary | ICD-10-CM | POA: Diagnosis present

## 2023-12-18 DIAGNOSIS — D3501 Benign neoplasm of right adrenal gland: Secondary | ICD-10-CM | POA: Diagnosis not present

## 2023-12-18 DIAGNOSIS — R161 Splenomegaly, not elsewhere classified: Secondary | ICD-10-CM | POA: Insufficient documentation

## 2023-12-18 DIAGNOSIS — K766 Portal hypertension: Secondary | ICD-10-CM | POA: Insufficient documentation

## 2023-12-18 MED ORDER — IOHEXOL 300 MG/ML  SOLN
75.0000 mL | Freq: Once | INTRAMUSCULAR | Status: AC | PRN
  Administered 2023-12-18: 75 mL via INTRAVENOUS

## 2023-12-21 LAB — METANEPHRINES, URINE, 24 HOUR
Metaneph Total, Ur: 24 ug/L
Metanephrines, 24H Ur: 34 ug/(24.h) — ABNORMAL LOW (ref 36–209)
Normetanephrine, 24H Ur: 671 ug/(24.h) — ABNORMAL HIGH (ref 131–612)
Normetanephrine, Ur: 479 ug/L
Total Volume: 1400

## 2023-12-22 ENCOUNTER — Ambulatory Visit: Payer: Self-pay | Admitting: Oncology

## 2023-12-23 NOTE — Progress Notes (Signed)
 [name], I see her next week for follow-up.  She was  and already see patient.  Dr. Davonna recommends surgical consult for adrenalectomy.  Do know who we should consult for this?  Delon Hope, NP 12/23/2023 12:34 PM

## 2023-12-25 NOTE — Progress Notes (Signed)
 Can you send a referral for her?  Delon Hope, NP 12/25/2023 12:18 PM

## 2023-12-26 ENCOUNTER — Other Ambulatory Visit: Payer: Self-pay

## 2023-12-29 ENCOUNTER — Inpatient Hospital Stay: Attending: Oncology | Admitting: Oncology

## 2023-12-29 VITALS — BP 116/84 | HR 81 | Temp 98.1°F | Resp 18 | Wt 210.8 lb

## 2023-12-29 DIAGNOSIS — Z809 Family history of malignant neoplasm, unspecified: Secondary | ICD-10-CM | POA: Diagnosis not present

## 2023-12-29 DIAGNOSIS — E279 Disorder of adrenal gland, unspecified: Secondary | ICD-10-CM | POA: Diagnosis present

## 2023-12-29 DIAGNOSIS — D72829 Elevated white blood cell count, unspecified: Secondary | ICD-10-CM | POA: Diagnosis not present

## 2023-12-29 DIAGNOSIS — Z7984 Long term (current) use of oral hypoglycemic drugs: Secondary | ICD-10-CM | POA: Insufficient documentation

## 2023-12-29 DIAGNOSIS — N184 Chronic kidney disease, stage 4 (severe): Secondary | ICD-10-CM | POA: Diagnosis not present

## 2023-12-29 DIAGNOSIS — Z7985 Long-term (current) use of injectable non-insulin antidiabetic drugs: Secondary | ICD-10-CM | POA: Diagnosis not present

## 2023-12-29 DIAGNOSIS — Z79899 Other long term (current) drug therapy: Secondary | ICD-10-CM | POA: Insufficient documentation

## 2023-12-29 DIAGNOSIS — Z86711 Personal history of pulmonary embolism: Secondary | ICD-10-CM | POA: Insufficient documentation

## 2023-12-29 DIAGNOSIS — I2510 Atherosclerotic heart disease of native coronary artery without angina pectoris: Secondary | ICD-10-CM | POA: Diagnosis not present

## 2023-12-29 DIAGNOSIS — Z79890 Hormone replacement therapy (postmenopausal): Secondary | ICD-10-CM | POA: Diagnosis not present

## 2023-12-29 DIAGNOSIS — E039 Hypothyroidism, unspecified: Secondary | ICD-10-CM | POA: Insufficient documentation

## 2023-12-29 DIAGNOSIS — Z794 Long term (current) use of insulin: Secondary | ICD-10-CM | POA: Diagnosis not present

## 2023-12-29 DIAGNOSIS — Z7901 Long term (current) use of anticoagulants: Secondary | ICD-10-CM | POA: Diagnosis not present

## 2023-12-29 DIAGNOSIS — M069 Rheumatoid arthritis, unspecified: Secondary | ICD-10-CM | POA: Diagnosis not present

## 2023-12-29 DIAGNOSIS — E1122 Type 2 diabetes mellitus with diabetic chronic kidney disease: Secondary | ICD-10-CM | POA: Insufficient documentation

## 2023-12-29 DIAGNOSIS — E278 Other specified disorders of adrenal gland: Secondary | ICD-10-CM

## 2023-12-29 NOTE — Progress Notes (Signed)
 [facility] [phone]   [phone]  INITIAL CONSULTATION:  Patient Care Team: Lonna Maxwell, PA-C as PCP - General Leni Marjory MATSU, MD as Consulting Physician (Rheumatology) Cindie Carlin POUR, DO as Consulting Physician (Gastroenterology) Onita Duos, MD as Consulting Physician (Neurology) Eartha Flavors, Toribio, MD as Consulting Physician (Gastroenterology) Sheldon Standing, MD as Consulting Physician (Colon and Rectal Surgery) Delores Dena RAMAN, RN as Oncology Nurse Navigator  CHIEF COMPLAINTS/PURPOSE OF CONSULTATION:  Adrenal mass  HISTORY OF PRESENTING ILLNESS:  [name] 68 y.o. female with medical history significant for diabetes, DVT, GERD, hyperlipidemia, IDA, hemorrhoidal bleeding and RA.  Patient has had chronic hemorrhoid problems with acute bleeding and pain.  She has had several hemorrhoidectomies and most recently with Dr. Sheldon on 04/02/2022.   On chart review, it appears she has had a 2 areas on her right adrenal that have been present since 2018.  The larger right adrenal mass measures originally measured 4.7 x 4.3 x 4.0 cm and smaller right adrenal mass measures 1.9 x 1.3 cm.  The left adrenal gland continues to have normal appearance.  Additional imaging from 2020 shows stability of the adrenal mass.  Patient was most recently seen by nephrology for stage IV chronic kidney disease and noted to have an increase in adrenal mass to 6.5 cm from 2020.  This was indeterminant on previous MRI from 2018.  Recommend further evaluation with dedicated adrenal protocol CT or MRI.   Reports chronic fatigue and low appetite levels.  No unintentional weight loss.  She has shortness of breath with exertion which limits her ability to walk for long periods.  Has dizziness when she stands too quickly and/or bends over to pet her cat.  Dizziness is self-limiting.  She has numbness and burning from her knees down and  some swelling in her left ankle.  Has trouble sleeping. Denies any falls.  Family history of cancers.  Most of her family has cardiovascular disease.  He is a non-smoker and drinker.  Today, she reports she has low energy and appetite is 75%.  Denies any pain.  Reports she has an appointment with [facility] on 01/09/2024.  She is currently taking iron and vitamin D supplements.  MEDICAL HISTORY:  Past Medical History:  Diagnosis Date   Anemia    Bradycardia    Diabetes (HCC)    Diabetic radiculopathy (HCC) 04/30/2017   High blood pressure    High cholesterol    Hypothyroidism    Pneumonia    Pulmonary embolism (HCC)    Rheumatoid arthritis (HCC) 10/25/2020    SURGICAL HISTORY: Past Surgical History:  Procedure Laterality Date   ABDOMINAL HYSTERECTOMY     APPENDECTOMY     BREAST SURGERY Left 1990   benign lump removed   COLONOSCOPY WITH PROPOFOL  N/A 04/17/2021   Procedure: COLONOSCOPY WITH PROPOFOL ;  Surgeon: Eartha Flavors Toribio, MD;  Location: [facility];  Service: Gastroenterology;  Laterality: N/A;  1215, moved to 7:30 per Anette Caldron at the office, pt knows new arrival time   ESOPHAGOGASTRODUODENOSCOPY (EGD) WITH PROPOFOL  N/A 04/17/2021   Procedure: ESOPHAGOGASTRODUODENOSCOPY (EGD) WITH PROPOFOL ;  Surgeon: Eartha Flavors Toribio, MD;  Location: [facility];  Service: Gastroenterology;  Laterality: N/A;   EVALUATION UNDER ANESTHESIA WITH HEMORRHOIDECTOMY N/A 02/07/2022   Procedure: HEMORRHOIDECTOMY WITH LIGATION AND HEMORRHOIDOPEXY. ANORECTAL EXAMINATION UNDER ANESTHESIA;  Surgeon: Sheldon Standing, MD;  Location: [facility];  Service: General;  Laterality: N/A;  GEN & LOCAL  EXCISIONAL HEMORRHOIDECTOMY  2011   Dr. Alwin - [facility]   EYE SURGERY Bilateral 2023   cataract surgery w/ lens   IVC FILTER INSERTION N/A 09/12/2016   Procedure: IVC Filter Insertion;  Surgeon: Laurence Redell CROME, MD;  Location: [facility];  Service: Cardiovascular;  Laterality:  N/A;   NECK SURGERY     POLYPECTOMY  04/17/2021   Procedure: POLYPECTOMY;  Surgeon: Eartha Flavors, Toribio, MD;  Location: [facility];  Service: Gastroenterology;;   REFRACTIVE SURGERY     TONSILLECTOMY      SOCIAL HISTORY: Social History   Socioeconomic History   Marital status: Married    Spouse name: Not on file   Number of children: 2   Years of education: 12   Highest education level: Not on file  Occupational History   Occupation: Unemployed- disabled  Tobacco Use   Smoking status: Former    Current packs/day: 0.00    Types: Cigarettes    Quit date: 1998    Years since quitting: 27.8   Smokeless tobacco: Never  Vaping Use   Vaping status: Never Used  Substance and Sexual Activity   Alcohol  use: No    Alcohol /week: 0.0 standard drinks of alcohol    Drug use: No   Sexual activity: Yes  Other Topics Concern   Not on file  Social History Narrative   Lives with spouse   Caffeine use: Coffee every morning (1 cup)   Right handed    Social Drivers of Health   Financial Resource Strain: Not on file  Food Insecurity: No Food Insecurity (12/12/2023)   Hunger Vital Sign    Worried About Running Out of Food in the Last Year: Never true    Ran Out of Food in the Last Year: Never true  Transportation Needs: No Transportation Needs (12/12/2023)   PRAPARE - Administrator, Civil Service (Medical): No    Lack of Transportation (Non-Medical): No  Physical Activity: Not on file  Stress: Not on file  Social Connections: Not on file  Intimate Partner Violence: Not At Risk (12/12/2023)   Humiliation, Afraid, Rape, and Kick questionnaire    Fear of Current or Ex-Partner: No    Emotionally Abused: No    Physically Abused: No    Sexually Abused: No    FAMILY HISTORY: Family History  Problem Relation Age of Onset   Hypertension Mother    Cancer Mother    Heart Problems Mother    Heart Problems Father    Diabetes Maternal Grandfather     ALLERGIES:   has no known allergies.  MEDICATIONS:  Current Outpatient Medications  Medication Sig Dispense Refill   acetaminophen  (TYLENOL ) 500 MG tablet Take 500 mg by mouth every 6 (six) hours as needed for moderate pain.     amitriptyline (ELAVIL) 50 MG tablet Take 50 mg by mouth daily.     amLODipine (NORVASC) 5 MG tablet Take 5 mg by mouth daily.     apixaban (ELIQUIS) 5 MG TABS tablet Take 5 mg by mouth 2 (two) times daily.     Cholecalciferol (VITAMIN D3) 50 MCG (2000 UT) TABS Take 1 tablet by mouth daily.     diazepam  (VALIUM ) 5 MG tablet Take 1 tablet (5 mg total) by mouth every 8 (eight) hours as needed for muscle spasms (difficulty urinating). 6 tablet 1   DULoxetine (CYMBALTA) 60 MG capsule Take 60 mg by mouth every evening.      etanercept (ENBREL) 50  MG/ML injection Inject 50 mg into the skin every Monday.     ezetimibe (ZETIA) 10 MG tablet TAKE ONE TABLET BY MOUTH AT BEDTIME. 30 tablet 2   ferrous gluconate  (FERGON) 240 (27 FE) MG tablet Take 1 tablet (240 mg total) by mouth 2 (two) times daily. 180 tablet 1   folic acid  (FOLVITE ) 1 MG tablet Take 1 mg by mouth daily.     gabapentin  (NEURONTIN ) 800 MG tablet TAKE ONE TABLET BY MOUTH TWICE DAILY 180 tablet 1   HUMALOG 100 UNIT/ML injection Inject 20 Units into the skin 3 (three) times daily before meals.     hydroxypropyl methylcellulose / hypromellose (ISOPTO TEARS / GONIOVISC) 2.5 % ophthalmic solution Place 1 drop into both eyes as needed for dry eyes.     LANTUS  SOLOSTAR 100 UNIT/ML Solostar Pen 40-42 Units See admin instructions. Inject 42 units into the skin in the morning and 40 units in the evening     levothyroxine  (SYNTHROID ) 100 MCG tablet Take 100 mcg by mouth daily before breakfast.     metFORMIN (GLUCOPHAGE-XR) 500 MG 24 hr tablet Take 1,000 mg by mouth 2 (two) times daily.     olmesartan-hydrochlorothiazide (BENICAR HCT) 40-25 MG per tablet Take 1 tablet by mouth in the morning.     oxyCODONE  (OXY IR/ROXICODONE ) 5 MG immediate  release tablet Take 1 tablet (5 mg total) by mouth every 6 (six) hours as needed for severe pain or breakthrough pain. 30 tablet 0   oxyCODONE  (ROXICODONE ) 5 MG immediate release tablet Take 1 tablet (5 mg total) by mouth every 6 (six) hours as needed for severe pain. 10 tablet 0   pantoprazole (PROTONIX) 40 MG tablet Take 40 mg by mouth 2 (two) times daily.     Red Yeast Rice Extract 600 MG CAPS Take 1,200 mg by mouth in the morning.     tirzepatide (MOUNJARO) 7.5 MG/0.5ML Pen Inject 7.5 mg into the skin every Thursday.     vitamin C (ASCORBIC ACID) 500 MG tablet Take 500 mg by mouth daily.     No current facility-administered medications for this visit.    REVIEW OF SYSTEMS:   Review of Systems  Constitutional:  Positive for malaise/fatigue.  Respiratory:  Positive for cough and shortness of breath.   Genitourinary:  Positive for frequency.  Neurological:  Positive for dizziness and tingling.  Psychiatric/Behavioral:  The patient has insomnia.     PHYSICAL EXAMINATION: ECOG PERFORMANCE STATUS: 1 - Symptomatic but completely ambulatory  Vitals:   12/29/23 1325  BP: 116/84  Pulse: 81  Resp: 18  Temp: 98.1 F (36.7 C)  SpO2: 100%   Filed Weights   12/29/23 1325  Weight: 210 lb 12.2 oz (95.6 kg)    Physical Exam Constitutional:      Appearance: Normal appearance.  Cardiovascular:     Rate and Rhythm: Normal rate and regular rhythm.  Pulmonary:     Effort: Pulmonary effort is normal.     Breath sounds: Normal breath sounds.  Abdominal:     General: Bowel sounds are normal. There is no distension.     Palpations: Abdomen is soft.     Tenderness: There is no abdominal tenderness. There is no right CVA tenderness or left CVA tenderness.  Musculoskeletal:        General: No swelling. Normal range of motion.  Neurological:     Mental Status: She is alert and oriented to person, place, and time. Mental status is at baseline.  LABORATORY DATA:  I have reviewed the  data as listed    Latest Ref Rng & Units 12/12/2023    9:23 AM 02/01/2022    8:30 AM 04/17/2021    7:17 AM  CBC  WBC 4.0 - 10.5 K/uL 10.9  9.8  8.8   Hemoglobin 12.0 - 15.0 g/dL 88.0  87.9  6.9   Hematocrit 36.0 - 46.0 % 35.5  36.7  24.3   Platelets 150 - 400 K/uL 253  218  280        Latest Ref Rng & Units 12/12/2023    9:23 AM 02/01/2022    8:30 AM 04/13/2021    2:11 PM  CMP  Glucose 70 - 99 mg/dL 868  859  94   BUN 8 - 23 mg/dL 24  16  18    Creatinine 0.44 - 1.00 mg/dL 8.30  8.77  8.78   Sodium 135 - 145 mmol/L 135  133  133   Potassium 3.5 - 5.1 mmol/L 5.0  4.2  4.5   Chloride 98 - 111 mmol/L 97  97  97   CO2 22 - 32 mmol/L 28  25  23    Calcium 8.9 - 10.3 mg/dL 89.9  9.6  9.5   Total Protein 6.5 - 8.1 g/dL 6.2     Total Bilirubin 0.0 - 1.2 mg/dL 0.2     Alkaline Phos 38 - 126 U/L 64     AST 15 - 41 U/L 18     ALT 0 - 44 U/L 11        RADIOGRAPHIC STUDIES: I have personally reviewed the radiological images as listed and agreed with the findings in the report. CT ABDOMEN W WO CONTRAST Result Date: 12/21/2023 EXAM: CT ABDOMEN WITH AND WITHOUT CONTRAST 12/18/2023 03:58:19 PM TECHNIQUE: CT of the abdomen was performed with and without the administration of intravenous contrast. Multiplanar reformatted images are provided for review. Automated exposure control, iterative reconstruction, and/or weight-based adjustment of the mA/kV was utilized to reduce the radiation dose to as low as reasonably achievable. 75mL of iohexol  (Omnipaque ) 300 MG/ML solution was administered. COMPARISON: Comparison is made to prior examinations on October 22, 2018, and December 17, 2016. An MRI examination from September 17, 2016, is unavailable for direct comparison. CLINICAL HISTORY: Adrenal mass, adrenal mass greater than 4 cm in diameter. Gave IV contrast after speaking with Dr. Joan. FINDINGS: LOWER CHEST: Mild bibasilar subpleural fragment fibrosis. HEPATOBILIARY: The liver demonstrates increasing contour  nodularity, relative hypertrophy of the left hepatic lobe, and atrophy of the right hepatic lobe, in keeping with changes of cirrhosis. Previously noted hepatic steatosis has resolved. No intrahepatic mass is identified. No intrahepatic biliary ductal dilation is seen. SPLEEN: Mild splenomegaly is present, with the spleen measuring 13.8 cm in greatest dimension. This appears new since the prior examination. ADRENAL GLANDS: A stable 2.4 cm benign adrenal adenoma is seen superiorly within the right adrenal gland. Inferiorly, there is a heterogeneous, well-circumscribed, rounded mass measuring 4.1 x 5.2 x 5.0 cm in size, which has progressively enlarged since prior examinations. The mass measures 46 Hounsfield units on precontrast, 48 Hounsfield units on portal venous, and 47 units on delayed phase images. The mass is indeterminate in its relatively poor enhancement, which could reflect an uncommon mass such as an adrenal ganglioneuroma. However, atypical enhancement of an adrenal pheochromocytoma or hemangioma could also result in a similar appearance. The left adrenal gland is unremarkable. KIDNEYS: A simple exophytic cortical cyst is seen arising from the  upper pole of the right kidney. The kidneys are otherwise unremarkable. No stones are seen in the kidneys or proximal ureters. No hydronephrosis is present. GI AND BOWEL: The visualized large and small bowel are unremarkable. No bowel obstruction is seen. No abnormal bowel wall thickening or distension is present. PERITONEUM AND RETROPERITONEUM: No ascites or free air is seen. An inferior vena cava filter is noted within the infrarenal cava. Extensive aortoiliac atherosclerotic calcification is present. Order systemic collateralization from the inferior mesenteric vein to the ovarian vein is noted. LYMPH NODES: No lymphadenopathy is seen. BONES AND SOFT TISSUES: Osseous structures are age-appropriate. No acute bone abnormality is seen. No lytic or blastic bone  lesion is present. IMPRESSION: 1. Stable 2.4 cm benign right adrenal adenoma. 2. Progressively enlarging 4.1 x 5.2 x 5.0 cm heterogeneous right adrenal mass with indeterminate enhancement (4648 HU across phases). Differential favors an adrenal ganglioneuroma though atypical enhancement of a pheochromocytoma, or less likely a hemangioma could appear in this fashion. . Recommend biochemical evaluation for pheochromocytoma and surgical consultation . 3. Interval development of cirrhosis with evidence of portal venous hypertension with mild splenomegaly and portosystemic collateralization. No intrahepatic mass identified. Electronically signed by: Dorethia Molt MD 12/21/2023 10:58 PM EDT RP Workstation: [id number]    ASSESSMENT & PLAN Adrenal mass: Reviewed CT abdomen with contrast from 12/21/2023-stable 2.4 cm benign right adrenal adenoma.  Progressively enlarging 4.1 x 5.2 x 5.0 cm heterogeneous right adrenal mass with indeterminate enhancement.  Differentials favor an adrenal ganglioneuroma though atypical enhancement of a pheochromocytoma or less likely hemangioma could appear in this fashion.  Recommend surgical consult. Patient was referred to [place] [facility] and has appointment on 01/09/2024 for possible removal. Recommend follow-up after surgery 4 to 6 months for follow-up. Reviewed labs from 12/12/2023 which showed normal cortisol levels, ACTH, mild anemia hemoglobin 11.9, elevated white blood cell count 10.9 and normal differential.  CMP showed CKD.  24-hour urine showed elevated Normetanephrine and a low metanephrine.   No orders of the defined types were placed in this encounter.  I spent 25 minutes dedicated to the care of this patient (face-to-face and non-face-to-face) on the date of the encounter to include what is described in the assessment and plan.   Delon Hope, AGNP-C Department of Hematology/Oncology [facility] Phone: [phone]

## 2024-01-05 ENCOUNTER — Telehealth: Payer: Self-pay | Admitting: "Endocrinology

## 2024-01-05 ENCOUNTER — Ambulatory Visit: Admitting: "Endocrinology

## 2024-01-05 ENCOUNTER — Encounter: Payer: Self-pay | Admitting: "Endocrinology

## 2024-01-05 VITALS — BP 116/56 | HR 76 | Ht 61.0 in | Wt 212.8 lb

## 2024-01-05 DIAGNOSIS — E278 Other specified disorders of adrenal gland: Secondary | ICD-10-CM | POA: Insufficient documentation

## 2024-01-05 NOTE — Telephone Encounter (Signed)
 Patient will call us  to schedule her follow up appt after she has her surgery date

## 2024-01-05 NOTE — Progress Notes (Signed)
 Endocrinology Consult Note                                            01/05/2024, 5:02 PM   Subjective:    Patient ID: [name], female    DOB: 05-14-1955, PCP Lonna Maxwell, PA-C   Past Medical History:  Diagnosis Date   Anemia    Bradycardia    Diabetes (HCC)    Diabetic radiculopathy (HCC) 04/30/2017   High blood pressure    High cholesterol    Hypothyroidism    Pneumonia    Pulmonary embolism (HCC)    Rheumatoid arthritis (HCC) 10/25/2020   Past Surgical History:  Procedure Laterality Date   ABDOMINAL HYSTERECTOMY     APPENDECTOMY     BREAST SURGERY Left 1990   benign lump removed   COLONOSCOPY WITH PROPOFOL  N/A 04/17/2021   Procedure: COLONOSCOPY WITH PROPOFOL ;  Surgeon: Eartha Angelia Sieving, MD;  Location: [facility];  Service: Gastroenterology;  Laterality: N/A;  1215, moved to 7:30 per Anette Caldron at the office, pt knows new arrival time   ESOPHAGOGASTRODUODENOSCOPY (EGD) WITH PROPOFOL  N/A 04/17/2021   Procedure: ESOPHAGOGASTRODUODENOSCOPY (EGD) WITH PROPOFOL ;  Surgeon: Eartha Angelia Sieving, MD;  Location: [facility];  Service: Gastroenterology;  Laterality: N/A;   EVALUATION UNDER ANESTHESIA WITH HEMORRHOIDECTOMY N/A 02/07/2022   Procedure: HEMORRHOIDECTOMY WITH LIGATION AND HEMORRHOIDOPEXY. ANORECTAL EXAMINATION UNDER ANESTHESIA;  Surgeon: Sheldon Standing, MD;  Location: [facility];  Service: General;  Laterality: N/A;  GEN & LOCAL   EXCISIONAL HEMORRHOIDECTOMY  2011   Dr. Alwin [facility]   EYE SURGERY Bilateral 2023   cataract surgery w/ lens   IVC FILTER INSERTION N/A 09/12/2016   Procedure: IVC Filter Insertion;  Surgeon: Laurence Redell CROME, MD;  Location: [facility];  Service: Cardiovascular;  Laterality: N/A;   NECK SURGERY     POLYPECTOMY  04/17/2021   Procedure: POLYPECTOMY;  Surgeon: Eartha Angelia, Sieving, MD;  Location: [facility];  Service: Gastroenterology;;   REFRACTIVE SURGERY     TONSILLECTOMY      Social History   Socioeconomic History   Marital status: Married    Spouse name: Not on file   Number of children: 2   Years of education: 12   Highest education level: Not on file  Occupational History   Occupation: Unemployed- disabled  Tobacco Use   Smoking status: Former    Current packs/day: 0.00    Types: Cigarettes    Quit date: 1998    Years since quitting: 27.8   Smokeless tobacco: Never  Vaping Use   Vaping status: Never Used  Substance and Sexual Activity   Alcohol  use: No    Alcohol /week: 0.0 standard drinks of alcohol    Drug use: No   Sexual activity: Yes  Other Topics Concern   Not on file  Social History Narrative   Lives with spouse   Caffeine use: Coffee every morning (1 cup)   Right handed    Social Drivers of Health   Financial Resource Strain: Not on file  Food Insecurity: No Food Insecurity (12/12/2023)   Hunger Vital Sign    Worried About Running Out of Food in the Last Year: Never true    Ran Out of Food in the Last Year: Never true  Transportation Needs: No Transportation Needs (12/12/2023)  PRAPARE - Administrator, Civil Service (Medical): No    Lack of Transportation (Non-Medical): No  Physical Activity: Not on file  Stress: Not on file  Social Connections: Not on file   Family History  Problem Relation Age of Onset   Hypertension Mother    Cancer Mother    Heart Problems Mother    Heart Problems Father    Diabetes Maternal Grandfather    Outpatient Encounter Medications as of 01/05/2024  Medication Sig   Inulin (FIBER CHOICE PO) Take 2 capsules by mouth daily.   LANTUS  SOLOSTAR 100 UNIT/ML Solostar Pen 40-42 Units See admin instructions. Inject 42 units into the skin in the morning and 40 units in the evening (Patient taking differently: 40 Units 2 (two) times daily. Inject 40 units into the skin in the morning and 40 units in the evening)   acetaminophen  (TYLENOL ) 500 MG tablet Take 500 mg by mouth every 6 (six)  hours as needed for moderate pain.   amitriptyline (ELAVIL) 50 MG tablet Take 50 mg by mouth daily.   amLODipine (NORVASC) 5 MG tablet Take 5 mg by mouth daily.   apixaban (ELIQUIS) 5 MG TABS tablet Take 5 mg by mouth 2 (two) times daily.   Cholecalciferol (VITAMIN D3) 50 MCG (2000 UT) TABS Take 1 tablet by mouth daily.   diazepam  (VALIUM ) 5 MG tablet Take 1 tablet (5 mg total) by mouth every 8 (eight) hours as needed for muscle spasms (difficulty urinating). (Patient not taking: Reported on 01/05/2024)   DULoxetine (CYMBALTA) 60 MG capsule Take 60 mg by mouth every evening.    etanercept (ENBREL) 50 MG/ML injection Inject 50 mg into the skin every Monday.   ezetimibe (ZETIA) 10 MG tablet TAKE ONE TABLET BY MOUTH AT BEDTIME.   ferrous gluconate  (FERGON) 240 (27 FE) MG tablet Take 1 tablet (240 mg total) by mouth 2 (two) times daily.   folic acid  (FOLVITE ) 1 MG tablet Take 1 mg by mouth daily.   gabapentin  (NEURONTIN ) 800 MG tablet TAKE ONE TABLET BY MOUTH TWICE DAILY (Patient not taking: Reported on 01/05/2024)   HUMALOG 100 UNIT/ML injection Inject 20 Units into the skin 3 (three) times daily before meals.   hydroxypropyl methylcellulose / hypromellose (ISOPTO TEARS / GONIOVISC) 2.5 % ophthalmic solution Place 1 drop into both eyes as needed for dry eyes. (Patient not taking: Reported on 01/05/2024)   levothyroxine  (SYNTHROID ) 100 MCG tablet Take 100 mcg by mouth daily before breakfast.   metFORMIN (GLUCOPHAGE-XR) 500 MG 24 hr tablet Take 1,000 mg by mouth 2 (two) times daily. (Patient not taking: Reported on 01/05/2024)   olmesartan-hydrochlorothiazide (BENICAR HCT) 40-25 MG per tablet Take 1 tablet by mouth in the morning.   oxyCODONE  (OXY IR/ROXICODONE ) 5 MG immediate release tablet Take 1 tablet (5 mg total) by mouth every 6 (six) hours as needed for severe pain or breakthrough pain. (Patient not taking: Reported on 01/05/2024)   oxyCODONE  (ROXICODONE ) 5 MG immediate release tablet Take 1  tablet (5 mg total) by mouth every 6 (six) hours as needed for severe pain. (Patient not taking: Reported on 01/05/2024)   pantoprazole (PROTONIX) 40 MG tablet Take 40 mg by mouth 2 (two) times daily.   Red Yeast Rice Extract 600 MG CAPS Take 1,200 mg by mouth in the morning.   tirzepatide (MOUNJARO) 7.5 MG/0.5ML Pen Inject 7.5 mg into the skin every Thursday.   vitamin C (ASCORBIC ACID) 500 MG tablet Take 500 mg by mouth daily. (Patient  not taking: Reported on 01/05/2024)   No facility-administered encounter medications on file as of 01/05/2024.   ALLERGIES: No Known Allergies  VACCINATION STATUS: Immunization History  Administered Date(s) Administered   BCG 02/25/2013    HPI [name] is 68 y.o. female who presents today with a medical history as above. she is being seen in consultation for right adrenal mass requested by Lonna Maxwell, PA-C.   Patient is accompanied by her husband to clinic. Per the family and review of her medical records, she was found to have adrenal nodule in August  2018 where it was reported to be 2 mm.  Repeat CT in October 2018 showed 4.6 cm right adrenal mass  (with Hounsfield units of -20) was documented while the smaller right adrenal mass was reported to be lipid rich adenoma.   Her most recent abdominal CT with and without contrast in October 2025 showed that the larger adrenal mass continued to grow to 5.2 cm with 46 Hounsfield units on precontrast and 48 Hounsfield units on portal venous and 47 units on delayed phase images.  The mass was said to be indeterminate in its relatively poor enhancement which could reflect an uncommon mass such as an adrenal ganglioneuroma.  However, atypical enhancement of an adrenal tumor chromocytoma or hemangioma was not ruled out.  Left adrenal gland was reported to be normal. She underwent hormonal evaluations including normal plasma metanephrines, and normal a.m. cortisol associated with normal ACTH. She is  already scheduled to meet with a surgeon next week. Her other medical problems include hypertension, type 2 diabetes on treatment. She denies any prior history of adrenal dysfunction.  Patient does not give any history of malignancy. She has well-controlled hypertension currently on amlodipine and Benicar.  She is on insulin , Mounjaro for type 2 diabetes . She did not bring any logs nor meter to review today.  She is working on her diabetes with her PMD.  Review of Systems  Constitutional: + Progressive weight gain, no fatigue, no subjective hyperthermia, no subjective hypothermia Eyes: no blurry vision, no xerophthalmia ENT: no sore throat, no nodules palpated in throat, no dysphagia/odynophagia, no hoarseness Cardiovascular: no Chest Pain, no Shortness of Breath, no palpitations, no leg swelling Respiratory: no cough, no shortness of breath Gastrointestinal: no Nausea/Vomiting/Diarhhea Musculoskeletal: no muscle/joint aches Skin: no rashes Neurological: no tremors, no numbness, no tingling, no dizziness Psychiatric: no depression, no anxiety  Objective:       01/05/2024    1:23 PM 12/29/2023    1:25 PM 02/07/2022    6:00 PM  Vitals with BMI  Height 5' 1    Weight 212 lbs 13 oz 210 lbs 12 oz   BMI 40.23    Systolic 116 116 876  Diastolic 56 84 64  Pulse 76 81 84    BP (!) 116/56   Pulse 76   Ht 5' 1 (1.549 m)   Wt 212 lb 12.8 oz (96.5 kg)   BMI 40.21 kg/m   Wt Readings from Last 3 Encounters:  01/05/24 212 lb 12.8 oz (96.5 kg)  12/29/23 210 lb 12.2 oz (95.6 kg)  02/07/22 182 lb (82.6 kg)    Physical Exam  Constitutional:  Body mass index is 40.21 kg/m.,  not in acute distress, normal state of mind Eyes: PERRLA, EOMI, no exophthalmos ENT: moist mucous membranes, no gross thyromegaly, no gross cervical lymphadenopathy  Skin: moist, warm, no rashes Neurological: no tremor with outstretched hands, Deep tendon reflexes normal in bilateral lower  extremities.  CMP (  most recent) CMP     Component Value Date/Time   NA 135 12/12/2023 0923   K 5.0 12/12/2023 0923   CL 97 (L) 12/12/2023 0923   CO2 28 12/12/2023 0923   GLUCOSE 131 (H) 12/12/2023 0923   BUN 24 (H) 12/12/2023 0923   CREATININE 1.69 (H) 12/12/2023 0923   CALCIUM 10.0 12/12/2023 0923   PROT 6.2 (L) 12/12/2023 0923   ALBUMIN 4.3 12/12/2023 0923   AST 18 12/12/2023 0923   ALT 11 12/12/2023 0923   ALKPHOS 64 12/12/2023 0923   BILITOT 0.2 12/12/2023 0923   GFRNONAA 33 (L) 12/12/2023 0923     Diabetic Labs (most recent): Lab Results  Component Value Date   HGBA1C 6.7 (H) 02/01/2022   HGBA1C 9.8 06/14/2015   HGBA1C 8 11/07/2014     Lipid Panel ( most recent) Lipid Panel     Component Value Date/Time   CHOL  01/21/2008 0636    174        ATP III CLASSIFICATION:  <200     mg/dL   Desirable  799-760  mg/dL   Borderline High  >=759    mg/dL   High   TRIG 831 (H) 88/73/7990 0636   HDL 28 (L) 01/21/2008 0636   CHOLHDL 6.2 01/21/2008 0636   VLDL 34 01/21/2008 0636   LDLCALC (H) 01/21/2008 0636    112        Total Cholesterol/HDL:CHD Risk Coronary Heart Disease Risk Table                     Men   Women  1/2 Average Risk   3.4   3.3         Latest Reference Range & Units 12/12/23 09:22 12/12/23 09:23  C206 ACTH 7.2 - 63.3 pg/mL 25.7   Cortisol, Plasma ug/dL  89.8      Component Ref Range & Units (hover) 3 wk ago  Normetanephrine, Ur 479  Comment: (NOTE) This test was developed and its performance characteristics determined by [facility]. It has not been cleared or approved by the Food and Drug Administration.  Normetanephrine, 24H Ur 671 High  ( 131-612)  Metaneph Total, Ur 24  Comment: (NOTE) This test was developed and its performance characteristics determined by [facility]. It has not been cleared or approved by the Food and Drug Administration.  Metanephrines, 24H Ur 34 Low (36-209)  Comment: (NOTE)     CT abdomen/pelvis with and without contrast on December 18, 2023  IMPRESSION: 1. Stable 2.4 cm benign right adrenal adenoma. 2. Progressively enlarging 4.1 x 5.2 x 5.0 cm heterogeneous right adrenal mass with indeterminate enhancement (4648 HU across phases). Differential favors an adrenal ganglioneuroma though atypical enhancement of a pheochromocytoma, or less likely a hemangioma could appear in this fashion.   Recommend biochemical  evaluation for pheochromocytoma and surgical consultation . 3. Interval development of cirrhosis with evidence of portal venous hypertension with mild splenomegaly and portosystemic collateralization. No intrahepatic mass identified.   Electronically signed by: Dorethia Molt MD 12/21/2023 10:58 PM EDT RP Workstation: [id number]  Assessment & Plan:   1. Adrenal mass, Right(Primary)  - [name]  is being seen at a kind request of Lonna Maxwell, PA-C. - I have reviewed her available  records and clinically evaluated the patient. - Based on these reviews, she has large, indeterminate, solid right adrenal mass with high Hounsfield units 46-48. Her previsit endocrine workup seems to be complete  documenting nonfunctioning adrenal mass.  Pheochromocytoma is unlikely without her biochemical confirmation.  Her workup also indicated and likelihood of adrenal cortisol excess.    She has already scheduled a consult with a surgeon in the [facility].  She can proceed with scheduled visit for possible surgical resection or biopsy. She will return after her surgery for endocrine evaluation.  -She is encouraged to maintain control of her diabetes with her PCP. - she is advised to maintain close follow up with Lonna Maxwell, PA-C for primary care needs.   -Thank you for involving me in the care of this pleasant patient.  Time spent with the patient: 45  minutes spent in  counseling her about right adrenal mass and the rest in obtaining information about her symptoms, reviewing her previous  labs/studies (including abstractions from other facilities),  evaluations, and treatments,  and developing a plan to confirm diagnosis and long term treatment based on the latest standards of care/guidelines; and documenting her care.  Jazzalyn Loewenstein Troxler Klecka participated in the discussions, expressed understanding, and voiced agreement with the above plans.  All questions were answered to her satisfaction. she is encouraged to contact clinic should she have any questions or concerns prior to her return visit.  Follow up plan: Return in about 8 weeks (around 03/01/2024), or FOLLOW UP AFTER HER ADRENAL SURGERY.   Ranny Earl, MD [facility] [address] [place], [place] [address] Phone: [phone]  Fax: [phone]     01/05/2024, 5:02 PM  This note was partially dictated with voice recognition software. Similar sounding words can be transcribed inadequately or may not  be corrected upon review.

## 2024-01-28 NOTE — Progress Notes (Signed)
 Primary Care Provider Jolee Greig DEL, NP [phone] [facility] [place] [address]     Patient Profile: [name] is a 68 y.o. female who presents to the [facility] at the request of Dr. Lonna for a consultation for evaluation and management of newly diagnosed cirrhosis.  History of Present Illness:  History of Present Illness [name] is a 68 year old female who presents with evaluation of an adrenal mass and liver disease. She was referred by Dr. Leonor Dawn for evaluation of liver disease in the context of an adrenal mass likely requiring surgery.  A CT scan on December 18, 2023, described an adrenal mass and a nodular liver. The spleen is mildly enlarged, and collateral blood vessels are present.  She has a history of diabetes, high blood pressure, hypothyroidism, high cholesterol, and rheumatoid arthritis. Her diabetes is managed with Lantus , Mounjaro, and Lispro. She takes amlodipine and Benicar for blood pressure, and Zetia for cholesterol. She is on Eliquis for a history of blood clots, which were initially treated with Coumadin.  She also has an IVC filter given her history of multiple blood clots.  She also takes Enbrel for rheumatoid arthritis, Cymbalta, and amitriptyline, which was increased from 25 mg to 50 mg after discontinuing gabapentin .  Her liver function tests were normal in 2017, 2016, and December 12, 2023. However, in 2024, her AST was slightly elevated at 43. A CT scan in 2020 noted hepatic steatosis.  She has no known previous awareness of liver disease.  There is no family history of liver disease, although her sister had a kidney transplant due to cancer.  She has a history of blood clots in her legs, with a filter placed nine years ago. She has not had any clots since switching to Eliquis. There is no known clotting disorder, and she does not have a hematologist.  She underwent an endoscopy and colonoscopy in 2023 for anemia, which  required a blood transfusion. The endoscopy showed no varices but did show hyperplastic polyp, and the colonoscopy revealed a 4 mm polyp in the sigmoid colon, with a recommendation for repeat in seven years. She is on iron supplementation once a day.  Her weight has increased. She has difficulty with physical activity due to leg issues .  CT scan below: IMPRESSION:  1. Stable 2.4 cm benign right adrenal adenoma.  2. Progressively enlarging 4.1 x 5.2 x 5.0 cm heterogeneous right adrenal mass  with indeterminate enhancement (4648 HU across phases). Differential favors an  adrenal ganglioneuroma though atypical enhancement of a pheochromocytoma, or  less likely a hemangioma could appear in this fashion. . Recommend biochemical  evaluation for pheochromocytoma and surgical consultation .  3. Interval development of cirrhosis with evidence of portal venous  hypertension with mild splenomegaly and portosystemic collateralization. No  intrahepatic mass identified.    Health Maintenance Variceal surveillance will review CT scan, discussed potential carvedilol but hesitant to start given the adrenal mass CRC screening up-to-date, due in 2030 [facility] surveillance CT 12/15/2023 HAV checking HBV checking Influenza not addressed Pneumonia not addressed Shingrix not addressed  General Review of Systems:  All other systems reviewed and negative except for as noted in the HPI  Past Medical History:  Diagnosis Date  . Anemia   . Arthritis   . Diabetes mellitus without complication (CMS/HHS-HCC)   . DVT (deep venous thrombosis) (CMS/HHS-HCC)   . GERD (gastroesophageal reflux disease)   . Hyperlipidemia     Past Surgical  History:  Procedure Laterality Date  . APPENDECTOMY    . COLON SURGERY    . cyst removed breast    . cyst removed from neck    . HYSTERECTOMY    . tonsilectomy      Allergies: Sulfa (sulfonamide antibiotics)  Medications: Current Outpatient Medications  Medication  Sig Dispense Refill  . amitriptyline (ELAVIL) 50 MG tablet Take 50 mg by mouth once daily    . amLODIPine (NORVASC) 5 MG tablet Take 5 mg by mouth once daily    . apixaban (ELIQUIS) 5 mg tablet Take 5 mg by mouth 2 (two) times daily    . cholecalciferol (VITAMIN D3) 2,000 unit tablet Take 2,000 Units by mouth once daily    . DULoxetine (CYMBALTA) 60 MG DR capsule Take 60 mg by mouth at bedtime    . ENBREL SURECLICK 50 mg/mL (1 mL) pen injector INJECT 50MG  SUBCUTANEOUSLY ONCE WEEKLY AS DIRECTED.    . ferrous sulfate (HIGH POTENCY IRON) 27 mg iron Tab Take by mouth    . folic acid  (FOLVITE ) 1 MG tablet Take 1,000 mcg by mouth once daily    . insulin  LISPRO (ADMELOG, HUMALOG) injection (concentration 100 units/mL) Inject 20 Units subcutaneously 3 (three) times daily with meals    . INULIN ORAL Take 2 capsules by mouth once daily    . LANTUS  SOLOSTAR U-100 INSULIN  pen injector (concentration 100 units/mL) ADMINISTER 40 UNITS UNDER THE SKIN TWICE DAILY    . levothyroxine  (SYNTHROID ) 100 MCG tablet Take 100 mcg by mouth once daily    . MOUNJARO 7.5 mg/0.5 mL pen injector Inject 7.5 mg subcutaneously every 7 (seven) days    . olmesartan-hydroCHLOROthiazide (BENICAR HCT) 40-25 mg tablet Take 1 tablet by mouth every morning    . pantoprazole (PROTONIX) 40 MG DR tablet Take 40 mg by mouth 2 (two) times daily    . red yeast rice 600 mg Cap Take 1,200 mg by mouth every morning    . ZETIA 10 mg tablet 1 tablet Orally Once a day     No current facility-administered medications for this visit.    Social History: She  reports that she quit smoking about 27 years ago. Her smoking use included cigarettes. She has never used smokeless tobacco. She reports that she does not currently use alcohol . She reports that she does not use drugs.  Family History: Her family history includes Coronary Artery Disease (Blocked arteries around heart) in her father and mother; High blood pressure (Hypertension) in her father;  Hyperlipidemia (Elevated cholesterol) in her father.  Physical Examination: Vitals:   01/28/24 1009  BP: (!) 144/77  Pulse: 85  Temp: 36.6 C (97.8 F)   Body mass index is 39.87 kg/m. Wt Readings from Last 3 Encounters:  01/28/24 95.7 kg (211 lb)  01/07/24 95.6 kg (210 lb 12.8 oz)  01/28/22 85.3 kg (188 lb)   General:  The patient is awake, alert, oriented & in no acute distress.   Psych: The mood & affect are noted to be appropriate. Head & Neck:  The neck is supple, there is no LAD or JVD.  The sclera are not injected nor icteric.   Lungs:  Full & clear bilaterally with no areas of dullness to percussion or egophony.  There is no respiratory distress. CV:  Regular, rate, and rhythm. No murmurs, rubs or gallops appreciated.  Abdomen:  Soft, non tender, non distended, positive bowel sounds.  There is no hepatosplenomegaly.  There is no ascites.  Extremities:  There is no LE edema Skin:  There is no rash.  There were no spider angiomata. Neuro:  Grossly intact.  The patient is able to stand slowly.  Assistance was needed on or off of the exam table.  Affect and speech normal.  No asterixis.    Labs and Studies: Results LABS AST: 18 (12/12/2023) ALT: 11 (12/12/2023) Alkaline phosphatase: 64 (12/12/2023) PLT: 253 (12/12/2023)  RADIOLOGY Abdominal CT: Adrenal mass, stable adrenal adenoma, nodular liver, enlarged left liver lobe, resolved hepatic steatosis, mild splenomegaly, collateral blood vessels (12/18/2023)  DIAGNOSTIC Endoscopy: Stomach polyps, no varices (2023) Colonoscopy: 4 mm sigmoid colon polyp, hyperplastic and inflammatory polyps (2023)  PATHOLOGY Benign hyperplastic polyp, reactive gastropathy, no infection, no metaplasia, no dysplasia, tubular adenoma (2023)  Assessment & Plan: [name] is a 68 y.o. female who presents today for an initial consultation for evaluation and management of newly diagnosed cirrhosis via imaging in the context of an adrenal mass  likely needing surgery.  She does have a history of steatosis on imaging in 2020 and metabolic risk factors.  Her liver enzymes have really been quite normal with the exception of 1 elevated AST of 43 when she was sick.  Her platelets are also normal in the 200 range.  Her CT scan does report a cirrhotic appearing liver and collateralization and splenomegaly.  I will get the CT for review.  He does have a history of multiple blood clots.  She tells me about 9 of them.  There is no clot described on the scan.  Thus I think that she does have underlying cirrhosis secondary to metabolic dysfunction associated steatotic liver disease.  Reassuringly her cirrhosis is compensated.  Overall her 30-day predicted mortality is 0.4% with open abdominal surgery.  The 90-day decompensation rate is only 2.6%.  Thus if surgery is indicated, I do think she is safe to undergo this from a liver perspective.  We discussed that it is a weighing of risks and benefits.  She does have other risk factors including CKD and clotting history that should be assessed.  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 Liver enzymes are normal, and no ascites is present. The condition is compensated, with no decompensation symptoms such as confusion, ascites, or bleeding.  - Ordered updated liver function tests and INR. - Will check hepatitis A and B vaccination status - Screen for liver cancer with ultrasound or CT scan twice a year.  This will need to be performed again in March.  She is getting regular scans for her adrenal mass and is up-to-date presently. - Monitor for varices with endoscopy as needed. - Will consider starting carvedilol if significant collaterals are present, pending endocrinologist approval.  Otherwise would  recommend a follow-up endoscopy. - Advised on lifestyle modifications including weight loss, healthy diet, and exercise.   Adrenal mass, under evaluation for surgical risk assessment Adrenal mass under evaluation for surgical risk due to potential liver disease. T- Ordered updated labs including INR for surgical risk assessment. - Will review CT scan for adrenal mass and liver condition. - Will discuss surgical plan with Dr. Dasie after reviewing updated labs and imaging.  Predicted Postoperative Outcomes by the VOCAL-Penn Score:     30-day mortality: 0.4%     90-day mortality: 2.1%     180-day mortality: 2.9%     90-day  decompensation: 2.6%  Return to clinic in 6 months.  I have asked her to reach out to me in 2 weeks to ensure I have reviewed her CT scan as I have requested this for review.      Attestation Statement:   I personally performed the service. (TP)  Katrina SAMMUEL NOVAK, MD

## 2024-01-29 ENCOUNTER — Ambulatory Visit: Admitting: "Endocrinology

## 2024-02-23 ENCOUNTER — Encounter: Payer: Self-pay | Admitting: *Deleted

## 2024-04-01 NOTE — Progress Notes (Signed)
 Rapid Diagnostic Service for Malignancy  Work-up Status:  Incomplete  Situation:  Patient is scheduled for adrenalectomy on 05/04/2024.  Patient called to cancel further oncology appointments because she does not have cancer.   Background: Patient referred to RDS at [facility] for an adrenal mass on 12/11/23.  Assessment:  Adrenal CT was done 12/12/23 which showed stable R adrenal adenoma and an enlarging R adrenal mass.  Response:  All appointments canceled per patient request.  Dena GORMAN Daring, RN  Oncology Nurse Navigator, Rapid Diagnostic Service for Malignancy 04/01/24 11:49 AM

## 2024-04-27 ENCOUNTER — Inpatient Hospital Stay

## 2024-05-04 ENCOUNTER — Inpatient Hospital Stay: Admitting: Oncology
